# Patient Record
Sex: Female | Born: 1965 | Race: White | Hispanic: No | Marital: Married | Smoking: Never smoker
Health system: Southern US, Community
[De-identification: ages and names within clinical notes are randomized; demographics above are authoritative.]

## PROBLEM LIST (undated history)

## (undated) DIAGNOSIS — I341 Nonrheumatic mitral (valve) prolapse: Secondary | ICD-10-CM

## (undated) DIAGNOSIS — K589 Irritable bowel syndrome without diarrhea: Secondary | ICD-10-CM

## (undated) DIAGNOSIS — D649 Anemia, unspecified: Secondary | ICD-10-CM

## (undated) DIAGNOSIS — R51 Headache: Secondary | ICD-10-CM

## (undated) DIAGNOSIS — I499 Cardiac arrhythmia, unspecified: Secondary | ICD-10-CM

## (undated) DIAGNOSIS — J45909 Unspecified asthma, uncomplicated: Secondary | ICD-10-CM

## (undated) DIAGNOSIS — K219 Gastro-esophageal reflux disease without esophagitis: Secondary | ICD-10-CM

## (undated) DIAGNOSIS — R519 Headache, unspecified: Secondary | ICD-10-CM

## (undated) DIAGNOSIS — L570 Actinic keratosis: Secondary | ICD-10-CM

## (undated) DIAGNOSIS — F32A Depression, unspecified: Secondary | ICD-10-CM

## (undated) DIAGNOSIS — Z8489 Family history of other specified conditions: Secondary | ICD-10-CM

## (undated) DIAGNOSIS — F419 Anxiety disorder, unspecified: Secondary | ICD-10-CM

## (undated) DIAGNOSIS — R011 Cardiac murmur, unspecified: Secondary | ICD-10-CM

## (undated) HISTORY — DX: Gastro-esophageal reflux disease without esophagitis: K21.9

## (undated) HISTORY — DX: Irritable bowel syndrome, unspecified: K58.9

## (undated) HISTORY — DX: Actinic keratosis: L57.0

## (undated) HISTORY — DX: Anemia, unspecified: D64.9

## (undated) HISTORY — DX: Unspecified asthma, uncomplicated: J45.909

## (undated) HISTORY — DX: Nonrheumatic mitral (valve) prolapse: I34.1

---

## 2004-06-26 ENCOUNTER — Ambulatory Visit: Payer: Self-pay | Admitting: Obstetrics and Gynecology

## 2004-07-01 ENCOUNTER — Ambulatory Visit: Payer: Self-pay | Admitting: Obstetrics and Gynecology

## 2005-03-31 ENCOUNTER — Other Ambulatory Visit: Payer: Self-pay

## 2005-03-31 ENCOUNTER — Emergency Department: Payer: Self-pay | Admitting: Emergency Medicine

## 2005-07-23 ENCOUNTER — Ambulatory Visit: Payer: Self-pay

## 2005-07-28 HISTORY — PX: COLONOSCOPY: SHX174

## 2005-07-28 HISTORY — PX: COLONOSCOPY WITH ESOPHAGOGASTRODUODENOSCOPY (EGD): SHX5779

## 2005-10-06 ENCOUNTER — Ambulatory Visit: Payer: Self-pay | Admitting: Gastroenterology

## 2005-10-13 ENCOUNTER — Ambulatory Visit: Payer: Self-pay | Admitting: Gastroenterology

## 2006-09-08 ENCOUNTER — Ambulatory Visit: Payer: Self-pay | Admitting: Obstetrics and Gynecology

## 2008-03-27 ENCOUNTER — Ambulatory Visit: Payer: Self-pay | Admitting: Internal Medicine

## 2008-03-28 ENCOUNTER — Ambulatory Visit: Payer: Self-pay | Admitting: Internal Medicine

## 2008-09-05 ENCOUNTER — Ambulatory Visit: Payer: Self-pay | Admitting: Gastroenterology

## 2009-07-28 HISTORY — PX: BREAST CYST ASPIRATION: SHX578

## 2009-07-28 HISTORY — PX: HERNIA REPAIR: SHX51

## 2009-07-28 HISTORY — PX: BREAST BIOPSY: SHX20

## 2009-10-23 ENCOUNTER — Ambulatory Visit: Payer: Self-pay | Admitting: Obstetrics and Gynecology

## 2009-11-05 ENCOUNTER — Ambulatory Visit: Payer: Self-pay | Admitting: Obstetrics and Gynecology

## 2010-01-02 DIAGNOSIS — D239 Other benign neoplasm of skin, unspecified: Secondary | ICD-10-CM

## 2010-01-02 HISTORY — DX: Other benign neoplasm of skin, unspecified: D23.9

## 2010-02-25 ENCOUNTER — Ambulatory Visit: Payer: Self-pay

## 2010-05-02 ENCOUNTER — Ambulatory Visit: Payer: Self-pay | Admitting: General Surgery

## 2010-06-14 ENCOUNTER — Ambulatory Visit: Payer: Self-pay | Admitting: Anesthesiology

## 2010-06-17 ENCOUNTER — Ambulatory Visit: Payer: Self-pay | Admitting: General Surgery

## 2010-09-06 ENCOUNTER — Ambulatory Visit: Payer: Self-pay | Admitting: Internal Medicine

## 2010-11-20 ENCOUNTER — Ambulatory Visit: Payer: Self-pay | Admitting: Obstetrics and Gynecology

## 2012-02-18 ENCOUNTER — Ambulatory Visit: Payer: Self-pay | Admitting: Obstetrics and Gynecology

## 2013-03-02 ENCOUNTER — Ambulatory Visit: Payer: Self-pay | Admitting: Obstetrics and Gynecology

## 2013-10-28 ENCOUNTER — Ambulatory Visit: Payer: Self-pay | Admitting: Obstetrics and Gynecology

## 2014-04-11 ENCOUNTER — Ambulatory Visit: Payer: Self-pay | Admitting: Obstetrics and Gynecology

## 2015-05-17 ENCOUNTER — Other Ambulatory Visit: Payer: Self-pay | Admitting: Obstetrics and Gynecology

## 2015-05-17 DIAGNOSIS — Z1231 Encounter for screening mammogram for malignant neoplasm of breast: Secondary | ICD-10-CM

## 2015-06-26 ENCOUNTER — Ambulatory Visit
Admission: RE | Admit: 2015-06-26 | Discharge: 2015-06-26 | Disposition: A | Discharge status: Home / Self Care | Payer: BC Managed Care – PPO | Source: Ambulatory Visit | Attending: Obstetrics and Gynecology | Admitting: Obstetrics and Gynecology

## 2015-06-26 DIAGNOSIS — Z1231 Encounter for screening mammogram for malignant neoplasm of breast: Secondary | ICD-10-CM | POA: Diagnosis present

## 2016-01-22 ENCOUNTER — Other Ambulatory Visit: Payer: Self-pay | Admitting: Internal Medicine

## 2016-01-22 DIAGNOSIS — R1011 Right upper quadrant pain: Secondary | ICD-10-CM

## 2016-01-23 ENCOUNTER — Ambulatory Visit
Admission: RE | Admit: 2016-01-23 | Discharge: 2016-01-23 | Disposition: A | Discharge status: Home / Self Care | Payer: BC Managed Care – PPO | Source: Ambulatory Visit | Attending: Internal Medicine | Admitting: Internal Medicine

## 2016-01-23 DIAGNOSIS — R1011 Right upper quadrant pain: Secondary | ICD-10-CM

## 2016-01-23 DIAGNOSIS — R935 Abnormal findings on diagnostic imaging of other abdominal regions, including retroperitoneum: Secondary | ICD-10-CM | POA: Insufficient documentation

## 2016-01-23 DIAGNOSIS — K8020 Calculus of gallbladder without cholecystitis without obstruction: Secondary | ICD-10-CM | POA: Insufficient documentation

## 2016-01-23 MED ORDER — TECHNETIUM TC 99M MEBROFENIN IV KIT
4.9900 | PACK | Freq: Once | INTRAVENOUS | Status: AC | PRN
  Administered 2016-01-23: 4.99 via INTRAVENOUS

## 2016-02-05 ENCOUNTER — Encounter: Payer: Self-pay | Admitting: *Deleted

## 2016-02-11 ENCOUNTER — Ambulatory Visit (INDEPENDENT_AMBULATORY_CARE_PROVIDER_SITE_OTHER): Payer: BC Managed Care – PPO | Admitting: General Surgery

## 2016-02-11 ENCOUNTER — Encounter: Payer: Self-pay | Admitting: General Surgery

## 2016-02-11 VITALS — BP 132/80 | HR 78 | Resp 12 | Ht 66.0 in | Wt 160.0 lb

## 2016-02-11 DIAGNOSIS — K8020 Calculus of gallbladder without cholecystitis without obstruction: Secondary | ICD-10-CM | POA: Diagnosis not present

## 2016-02-11 NOTE — Progress Notes (Addendum)
Patient ID: Leslie Lowe, female   DOB: [birth year], 50 y.o.   MRN: [id number]  Chief Complaint  Patient presents with  . Abdominal Pain    HPI Leslie Lowe is a 50 y.o. female here today for a evaluation of abdominal pain.  She states the pain is in her right upper quadrant and radiates to her right mid back area been going on for a month..Pain mainly at night, she states she has Then awakened during the middle of the night.  Has had a lot of acid reflex and bloating, but this is distinctly different from her present right upper quadrant pain. ColonoscopyAnd upper endoscopy performed in 2007 for IBS. Moves her bowels daily.   Patient had an ultrasound and HIDA scan done on 01/23/16. During her HIDA scan no abdominal pain.   I personally reviewed the patient's history. HPI  Past Medical History  Diagnosis Date  . Asthma   . Anemia   . Mitral valve prolapse   . IBS (irritable bowel syndrome)   . GERD (gastroesophageal reflux disease)     Past Surgical History  Procedure Laterality Date  . Breast cyst aspiration Right 2011    neg  . Breast biopsy Right 2011    neg/ ultrasound guided core  . Breast biopsy Right 2011  . Hernia repair  [id number]    umbilical  . Colonoscopy  2007    Family History  Problem Relation Age of Onset  . Breast cancer Maternal Aunt 44  . Breast cancer Maternal Grandmother 24  . Diabetes Brother   . Hypertension Mother   . Asthma Mother     Social History Social History  Substance Use Topics  . Smoking status: Never Smoker   . Smokeless tobacco: None  . Alcohol Use: 2.4 oz/week    4 Standard drinks or equivalent per week    Allergies  Allergen Reactions  . Erythromycin Ethylsuccinate Nausea And Vomiting  . Promethazine Nausea And Vomiting  . Sulfa Antibiotics Hives    Current Outpatient Prescriptions  Medication Sig Dispense Refill  . albuterol (PROAIR HFA) 108 (90 Base) MCG/ACT inhaler Inhale into the lungs.    Marland Kitchen amitriptyline  (ELAVIL) 10 MG tablet Take 20 mg by mouth at bedtime.  1  . aspirin-acetaminophen-caffeine (EXCEDRIN MIGRAINE) 250-250-65 MG tablet Take by mouth as needed.    . Budesonide 90 MCG/ACT inhaler Inhale into the lungs.    . Cetirizine HCl 10 MG CAPS Take by mouth.    . fluticasone (FLONASE) 50 MCG/ACT nasal spray     . pantoprazole (PROTONIX) 40 MG tablet Take 40 mg by mouth daily.  2   No current facility-administered medications for this visit.    Review of Systems Review of Systems  Constitutional: Negative.   Respiratory: Negative.   Cardiovascular: Negative.   Gastrointestinal: Positive for abdominal pain and diarrhea.    Blood pressure 132/80, pulse 78, resp. rate 12, height 5\' 6"  (1.676 m), weight 160 lb (72.576 kg), last menstrual period 02/03/2016.  Physical Exam Physical Exam  Constitutional: She is oriented to person, place, and time. She appears well-developed and well-nourished.  Eyes: Conjunctivae are normal. No scleral icterus.  Neck: Neck supple.  Cardiovascular: Normal rate and regular rhythm.   Pulmonary/Chest: Effort normal and breath sounds normal.  Abdominal: Soft. Normal appearance and bowel sounds are normal. There is no hepatomegaly. There is no tenderness. No hernia.  Lymphadenopathy:    She has no cervical adenopathy.  Neurological: She is alert and oriented  to person, place, and time.  Skin: Skin is warm and dry.    Data Reviewed Abdominal ultrasound of 01/23/2016 was reviewed. Multiple foci, largest measuring 2.2 cm. No gallbladder wall thickening. Common bile duct 2 mm. Normal liver parenchymal echogenicity.  HIDA scan with ejection fraction dated 01/23/2016 ordered an ejection fraction of 22% after 8 ounces of unsure. No symptoms of abdominal pain reported by the patient.  PCP notes from Emily Filbert, M.D. dated 01/22/2016 reviewed.  PCP labs from January 2017 showed a hemoglobin 11.7 with an MCV of 82. Normal comprehensive metabolic  panel.  [id number] umbilical hernia repair was completed making use of a small Ventralex patch.  Assessment    Symptomatic gallstones. Lack of symptoms during "stimulation" with ensure likely secondary to chronic dysfunction.    Plan    Options for management were reviewed: Observation versus elective cholecystectomy. With the large stone she is not a candidate for oral dissolution therapy.  Laparoscopic Cholecystectomy with Intraoperative Cholangiogram. The procedure, including it's potential risks and complications (including but not limited to infection, bleeding, injury to intra-abdominal organs or bile ducts, bile leak, poor cosmetic result, sepsis and death) were discussed with the patient in detail. Non-operative options, including their inherent risks (acute calculous cholecystitis with possible choledocholithiasis or gallstone pancreatitis, with the risk of ascending cholangitis, sepsis, and death) were discussed as well. The patient expressed and understanding of what we discussed and wishes to proceed with laparoscopic cholecystectomy. The patient further understands that if it is technically not possible, or it is unsafe to proceed laparoscopically, that I will convert to an open cholecystectomy.  Patient's surgery has been scheduled for 03-10-16 at [facility].     PCP:  Emily Filbert  This information has been scribed by Gaspar Cola CMA.     Robert Bellow 02/12/2016, 11:48 AM

## 2016-02-11 NOTE — Patient Instructions (Signed)
Laparoscopic Cholecystectomy Laparoscopic cholecystectomy is surgery to remove the gallbladder. The gallbladder is located in the upper right part of the abdomen, behind the liver. It is a storage sac for bile, which is produced in the liver. Bile aids in the digestion and absorption of fats. Cholecystectomy is often done for inflammation of the gallbladder (cholecystitis). This condition is usually caused by a buildup of gallstones (cholelithiasis) in the gallbladder. Gallstones can block the flow of bile, and that can result in inflammation and pain. In severe cases, emergency surgery may be required. If emergency surgery is not required, you will have time to prepare for the procedure. Laparoscopic surgery is an alternative to open surgery. Laparoscopic surgery has a shorter recovery time. Your common bile duct may also need to be examined during the procedure. If stones are found in the common bile duct, they may be removed. LET YOUR HEALTH CARE PROVIDER KNOW ABOUT:  Any allergies you have.  All medicines you are taking, including vitamins, herbs, eye drops, creams, and over-the-counter medicines.  Previous problems you or members of your family have had with the use of anesthetics.  Any blood disorders you have.  Previous surgeries you have had.  Any medical conditions you have. RISKS AND COMPLICATIONS Generally, this is a safe procedure. However, problems may occur, including:  Infection.  Bleeding.  Allergic reactions to medicines.  Damage to other structures or organs.  A stone remaining in the common bile duct.  A bile leak from the cyst duct that is clipped when your gallbladder is removed.  The need to convert to open surgery, which requires a larger incision in the abdomen. This may be necessary if your surgeon thinks that it is not safe to continue with a laparoscopic procedure. BEFORE THE PROCEDURE  Ask your health care provider about:  Changing or stopping your  regular medicines. This is especially important if you are taking diabetes medicines or blood thinners.  Taking medicines such as aspirin and ibuprofen. These medicines can thin your blood. Do not take these medicines before your procedure if your health care provider instructs you not to.  Follow instructions from your health care provider about eating or drinking restrictions.  Let your health care provider know if you develop a cold or an infection before surgery.  Plan to have someone take you home after the procedure.  Ask your health care provider how your surgical site will be marked or identified.  You may be given antibiotic medicine to help prevent infection. PROCEDURE  To reduce your risk of infection:  Your health care team will wash or sanitize their hands.  Your skin will be washed with soap.  An IV tube may be inserted into one of your veins.  You will be given a medicine to make you fall asleep (general anesthetic).  A breathing tube will be placed in your mouth.  The surgeon will make several small cuts (incisions) in your abdomen.  A thin, lighted tube (laparoscope) that has a tiny camera on the end will be inserted through one of the small incisions. The camera on the laparoscope will send a picture to a TV screen (monitor) in the operating room. This will give the surgeon a good view inside your abdomen.  A gas will be pumped into your abdomen. This will expand your abdomen to give the surgeon more room to perform the surgery.  Other tools that are needed for the procedure will be inserted through the other incisions. The gallbladder will   be removed through one of the incisions.  After your gallbladder has been removed, the incisions will be closed with stitches (sutures), staples, or skin glue.  Your incisions may be covered with a bandage (dressing). The procedure may vary among health care providers and hospitals. AFTER THE PROCEDURE  Your blood  pressure, heart rate, breathing rate, and blood oxygen level will be monitored often until the medicines you were given have worn off.  You will be given medicines as needed to control your pain.   This information is not intended to replace advice given to you by your health care provider. Make sure you discuss any questions you have with your health care provider.   Document Released: 07/14/2005 Document Revised: 04/04/2015 Document Reviewed: 02/23/2013 Elsevier Interactive Patient Education 2016 [employer] Inc[employer]  

## 2016-02-12 DIAGNOSIS — K8020 Calculus of gallbladder without cholecystitis without obstruction: Secondary | ICD-10-CM | POA: Insufficient documentation

## 2016-02-12 NOTE — Addendum Note (Signed)
Addended by: Robert Bellow on: 02/12/2016 11:59 AM   Modules accepted: Orders

## 2016-02-12 NOTE — H&P (Signed)
HPI  Leslie Lowe is a 50 y.o. female here today for a evaluation of abdominal pain. She states the pain is in her right upper quadrant and radiates to her right mid back area been going on for a month..Pain mainly at night, she states she has Then awakened during the middle of the night. Has had a lot of acid reflex and bloating, but this is distinctly different from her present right upper quadrant pain.  ColonoscopyAnd upper endoscopy performed in 2007 for IBS.  Moves her bowels daily.  Patient had an ultrasound and HIDA scan done on 01/23/16. During her HIDA scan no abdominal pain.  I personally reviewed the patient's history.  HPI  Past Medical History   Diagnosis  Date   .  Asthma    .  Anemia    .  Mitral valve prolapse    .  IBS (irritable bowel syndrome)    .  GERD (gastroesophageal reflux disease)     Past Surgical History   Procedure  Laterality  Date   .  Breast cyst aspiration  Right  2011     neg   .  Breast biopsy  Right  2011     neg/ ultrasound guided core   .  Breast biopsy  Right  2011   .  Hernia repair   [id number]     umbilical   .  Colonoscopy   2007    Family History   Problem  Relation  Age of Onset   .  Breast cancer  Maternal Aunt  65   .  Breast cancer  Maternal Grandmother  44   .  Diabetes  Brother    .  Hypertension  Mother    .  Asthma  Mother     Social History  Social History   Substance Use Topics   .  Smoking status:  Never Smoker   .  Smokeless tobacco:  None   .  Alcohol Use:  2.4 oz/week     4 Standard drinks or equivalent per week    Allergies   Allergen  Reactions   .  Erythromycin Ethylsuccinate  Nausea And Vomiting   .  Promethazine  Nausea And Vomiting   .  Sulfa Antibiotics  Hives    Current Outpatient Prescriptions   Medication  Sig  Dispense  Refill   .  albuterol (PROAIR HFA) 108 (90 Base) MCG/ACT inhaler  Inhale into the lungs.     Marland Kitchen  amitriptyline (ELAVIL) 10 MG tablet  Take 20 mg by mouth at bedtime.   1   .   aspirin-acetaminophen-caffeine (EXCEDRIN MIGRAINE) 250-250-65 MG tablet  Take by mouth as needed.     .  Budesonide 90 MCG/ACT inhaler  Inhale into the lungs.     .  Cetirizine HCl 10 MG CAPS  Take by mouth.     .  fluticasone (FLONASE) 50 MCG/ACT nasal spray      .  pantoprazole (PROTONIX) 40 MG tablet  Take 40 mg by mouth daily.   2    No current facility-administered medications for this visit.    Review of Systems  Review of Systems  Constitutional: Negative.  Respiratory: Negative.  Cardiovascular: Negative.  Gastrointestinal: Positive for abdominal pain and diarrhea.   Blood pressure 132/80, pulse 78, resp. rate 12, height 5\' 6"  (1.676 m), weight 160 lb (72.576 kg), last menstrual period 02/03/2016.  Physical Exam  Physical Exam  Constitutional: She is oriented to person,  place, and time. She appears well-developed and well-nourished.  Eyes: Conjunctivae are normal. No scleral icterus.  Neck: Neck supple.  Cardiovascular: Normal rate and regular rhythm.  Pulmonary/Chest: Effort normal and breath sounds normal.  Abdominal: Soft. Normal appearance and bowel sounds are normal. There is no hepatomegaly. There is no tenderness. No hernia.  Lymphadenopathy:  She has no cervical adenopathy.  Neurological: She is alert and oriented to person, place, and time.  Skin: Skin is warm and dry.   Data Reviewed  Abdominal ultrasound of 01/23/2016 was reviewed. Multiple foci, largest measuring 2.2 cm. No gallbladder wall thickening. Common bile duct 2 mm. Normal liver parenchymal echogenicity.  HIDA scan with ejection fraction dated 01/23/2016 ordered an ejection fraction of 22% after 8 ounces of unsure. No symptoms of abdominal pain reported by the patient.  PCP notes from Emily Filbert, M.D. dated 01/22/2016 reviewed.  PCP labs from January 2017 showed a hemoglobin 11.7 with an MCV of 82. Normal comprehensive metabolic panel.  99991111 umbilical hernia repair was completed making use of a  small Ventralex patch.  Assessment   Symptomatic gallstones. Lack of symptoms during "stimulation" with ensure likely secondary to chronic dysfunction.   Plan   Options for management were reviewed: Observation versus elective cholecystectomy. With the large stone she is not a candidate for oral dissolution therapy.  Laparoscopic Cholecystectomy with Intraoperative Cholangiogram. The procedure, including it's potential risks and complications (including but not limited to infection, bleeding, injury to intra-abdominal organs or bile ducts, bile leak, poor cosmetic result, sepsis and death) were discussed with the patient in detail. Non-operative options, including their inherent risks (acute calculous cholecystitis with possible choledocholithiasis or gallstone pancreatitis, with the risk of ascending cholangitis, sepsis, and death) were discussed as well. The patient expressed and understanding of what we discussed and wishes to proceed with laparoscopic cholecystectomy. The patient further understands that if it is technically not possible, or it is unsafe to proceed laparoscopically, that I will convert to an open cholecystectomy.  Patient's surgery has been scheduled for 03-10-16 at [facility]  PCP: Emily Filbert  This information has been scribed by Gaspar Cola CMA.  Robert Bellow  02/12/2016, 11:48 AM

## 2016-02-28 ENCOUNTER — Encounter
Admission: RE | Admit: 2016-02-28 | Discharge: 2016-02-28 | Disposition: A | Discharge status: Home / Self Care | Payer: BC Managed Care – PPO | Source: Ambulatory Visit | Attending: General Surgery | Admitting: General Surgery

## 2016-02-28 DIAGNOSIS — I341 Nonrheumatic mitral (valve) prolapse: Secondary | ICD-10-CM | POA: Diagnosis not present

## 2016-02-28 HISTORY — DX: Family history of other specified conditions: Z84.89

## 2016-02-28 HISTORY — DX: Cardiac arrhythmia, unspecified: I49.9

## 2016-02-28 HISTORY — DX: Headache, unspecified: R51.9

## 2016-02-28 HISTORY — DX: Headache: R51

## 2016-02-28 NOTE — Patient Instructions (Signed)
  Your procedure is scheduled on: 03-10-16 [facility]) Report to Same Day Surgery 2nd floor medical mall To find out your arrival time please call [phone] between 1PM - 3PM on 03-07-16 (FRIDAY)  Remember: Instructions that are not followed completely may result in serious medical risk, up to and including death, or upon the discretion of your surgeon and anesthesiologist your surgery may need to be rescheduled.    _x___ 1. Do not eat food or drink liquids after midnight. No gum chewing or hard candies.     __x__ 2. No Alcohol for 24 hours before or after surgery.   __x__3. No Smoking for 24 prior to surgery.   ____  4. Bring all medications with you on the day of surgery if instructed.    __x__ 5. Notify your doctor if there is any change in your medical condition     (cold, fever, infections).     Do not wear jewelry, make-up, hairpins, clips or nail polish.  Do not wear lotions, powders, or perfumes. You may wear deodorant.  Do not shave 48 hours prior to surgery. Men may shave face and neck.  Do not bring valuables to the hospital.    [facility] is not responsible for any belongings or valuables.               Contacts, dentures or bridgework may not be worn into surgery.  Leave your suitcase in the car. After surgery it may be brought to your room.  For patients admitted to the hospital, discharge time is determined by your treatment team.   Patients discharged the day of surgery will not be allowed to drive home.    Please read over the following fact sheets that you were given:   [facility] Preparing for Surgery and or MRSA Information   _x___ Take these medicines the morning of surgery with A SIP OF WATER:    1. PROTONIX  2. ZYRTEC  3.  4.  5.  6.  ____ Fleet Enema (as directed)   _x___ Use CHG Soap or sage wipes as directed on instruction sheet   _X___ Use inhalers on the day of surgery and bring to hospital day of surgery-USE ALBUTEROL INHALER AT [facility]  ____ Stop metformin 2 days prior to surgery    ____ Take 1/2 of usual insulin dose the night before surgery and none on the morning of surgery.   ____ Stop aspirin or coumadin, or plavix  ___ Stop Anti-inflammatories such as Advil, Aleve, Ibuprofen, Motrin, Naproxen,          Naprosyn, Goodies powders or aspirin products. Ok to take Tylenol.   ____ Stop supplements until after surgery.    ____ Bring C-Pap to the hospital.

## 2016-03-03 ENCOUNTER — Encounter
Admission: RE | Admit: 2016-03-03 | Discharge: 2016-03-03 | Disposition: A | Discharge status: Home / Self Care | Payer: BC Managed Care – PPO | Source: Ambulatory Visit | Attending: General Surgery | Admitting: General Surgery

## 2016-03-07 MED ORDER — DEXTROSE 5 % IV SOLN: 2.0000 g | Freq: Once | INTRAVENOUS | Status: DC

## 2016-03-07 NOTE — Pre-Procedure Instructions (Signed)
Pt with MVP who has to be premedicated with an antibiotic prior to dental procedures-pt having her gallbladder taken out on Monday 8-14 and does not have an antibiotic ordered preop- called Melanie at Dr [place] office to see if antibiotic is needed preop for gallbladder surgery due to MVP- Melanie to check with Gwenette Greet about this as Dr Bary Castilla is out of town currently

## 2016-03-07 NOTE — Addendum Note (Signed)
Addended by: Christene Lye on: 03/07/2016 09:58 AM   Modules accepted: Orders

## 2016-03-10 ENCOUNTER — Encounter: Payer: Self-pay | Admitting: *Deleted

## 2016-03-10 ENCOUNTER — Ambulatory Visit: Payer: BC Managed Care – PPO | Admitting: Certified Registered"

## 2016-03-10 ENCOUNTER — Encounter
Admission: RE | Disposition: A | Discharge status: Home / Self Care | Payer: Self-pay | Source: Ambulatory Visit | Attending: General Surgery

## 2016-03-10 ENCOUNTER — Ambulatory Visit
Admission: RE | Admit: 2016-03-10 | Discharge: 2016-03-10 | Disposition: A | Discharge status: Home / Self Care | Payer: BC Managed Care – PPO | Source: Ambulatory Visit | Attending: General Surgery | Admitting: General Surgery

## 2016-03-10 ENCOUNTER — Ambulatory Visit: Payer: BC Managed Care – PPO

## 2016-03-10 DIAGNOSIS — J45909 Unspecified asthma, uncomplicated: Secondary | ICD-10-CM | POA: Diagnosis not present

## 2016-03-10 DIAGNOSIS — K8000 Calculus of gallbladder with acute cholecystitis without obstruction: Secondary | ICD-10-CM

## 2016-03-10 DIAGNOSIS — Z882 Allergy status to sulfonamides status: Secondary | ICD-10-CM | POA: Insufficient documentation

## 2016-03-10 DIAGNOSIS — K219 Gastro-esophageal reflux disease without esophagitis: Secondary | ICD-10-CM | POA: Diagnosis not present

## 2016-03-10 DIAGNOSIS — K8020 Calculus of gallbladder without cholecystitis without obstruction: Secondary | ICD-10-CM

## 2016-03-10 DIAGNOSIS — K8044 Calculus of bile duct with chronic cholecystitis without obstruction: Secondary | ICD-10-CM | POA: Diagnosis not present

## 2016-03-10 DIAGNOSIS — Z881 Allergy status to other antibiotic agents status: Secondary | ICD-10-CM | POA: Diagnosis not present

## 2016-03-10 DIAGNOSIS — Z91013 Allergy to seafood: Secondary | ICD-10-CM | POA: Insufficient documentation

## 2016-03-10 DIAGNOSIS — Z888 Allergy status to other drugs, medicaments and biological substances status: Secondary | ICD-10-CM | POA: Insufficient documentation

## 2016-03-10 DIAGNOSIS — K8064 Calculus of gallbladder and bile duct with chronic cholecystitis without obstruction: Secondary | ICD-10-CM | POA: Diagnosis not present

## 2016-03-10 DIAGNOSIS — K589 Irritable bowel syndrome without diarrhea: Secondary | ICD-10-CM | POA: Insufficient documentation

## 2016-03-10 DIAGNOSIS — Z7951 Long term (current) use of inhaled steroids: Secondary | ICD-10-CM | POA: Insufficient documentation

## 2016-03-10 DIAGNOSIS — Z79899 Other long term (current) drug therapy: Secondary | ICD-10-CM | POA: Diagnosis not present

## 2016-03-10 DIAGNOSIS — I341 Nonrheumatic mitral (valve) prolapse: Secondary | ICD-10-CM | POA: Insufficient documentation

## 2016-03-10 DIAGNOSIS — Z7982 Long term (current) use of aspirin: Secondary | ICD-10-CM | POA: Insufficient documentation

## 2016-03-10 DIAGNOSIS — Z9889 Other specified postprocedural states: Secondary | ICD-10-CM | POA: Diagnosis not present

## 2016-03-10 HISTORY — PX: CHOLECYSTECTOMY: SHX55

## 2016-03-10 LAB — POCT PREGNANCY, URINE: PREG TEST UR: NEGATIVE

## 2016-03-10 SURGERY — LAPAROSCOPIC CHOLECYSTECTOMY WITH INTRAOPERATIVE CHOLANGIOGRAM
Anesthesia: General | Wound class: Clean Contaminated

## 2016-03-10 MED ORDER — HYDROMORPHONE HCL 1 MG/ML IJ SOLN
INTRAMUSCULAR | Status: AC
  Filled 2016-03-10: qty 1

## 2016-03-10 MED ORDER — FENTANYL CITRATE (PF) 100 MCG/2ML IJ SOLN
INTRAMUSCULAR | Status: DC | PRN
  Administered 2016-03-10: 50 ug via INTRAVENOUS
  Administered 2016-03-10: 100 ug via INTRAVENOUS
  Administered 2016-03-10 (×2): 50 ug via INTRAVENOUS

## 2016-03-10 MED ORDER — KETOROLAC TROMETHAMINE 30 MG/ML IJ SOLN
30.0000 mg | Freq: Once | INTRAMUSCULAR | Status: AC
  Administered 2016-03-10: 30 mg via INTRAVENOUS

## 2016-03-10 MED ORDER — ROCURONIUM BROMIDE 100 MG/10ML IV SOLN
INTRAVENOUS | Status: DC | PRN
  Administered 2016-03-10 (×2): 10 mg via INTRAVENOUS
  Administered 2016-03-10: 40 mg via INTRAVENOUS

## 2016-03-10 MED ORDER — LIDOCAINE HCL (CARDIAC) 20 MG/ML IV SOLN
INTRAVENOUS | Status: DC | PRN
  Administered 2016-03-10: 80 mg via INTRAVENOUS

## 2016-03-10 MED ORDER — NEOSTIGMINE METHYLSULFATE 10 MG/10ML IV SOLN
INTRAVENOUS | Status: DC | PRN
  Administered 2016-03-10: 3 mg via INTRAVENOUS

## 2016-03-10 MED ORDER — SCOPOLAMINE 1 MG/3DAYS TD PT72
1.0000 | MEDICATED_PATCH | Freq: Once | TRANSDERMAL | Status: DC
  Administered 2016-03-10: 1.5 mg via TRANSDERMAL

## 2016-03-10 MED ORDER — SCOPOLAMINE 1 MG/3DAYS TD PT72
MEDICATED_PATCH | TRANSDERMAL | Status: AC
  Administered 2016-03-10: 1.5 mg via TRANSDERMAL
  Filled 2016-03-10: qty 1

## 2016-03-10 MED ORDER — SODIUM CHLORIDE 0.9 % IJ SOLN
INTRAMUSCULAR | Status: AC
  Filled 2016-03-10: qty 50

## 2016-03-10 MED ORDER — ACETAMINOPHEN 10 MG/ML IV SOLN
INTRAVENOUS | Status: AC
  Filled 2016-03-10: qty 100

## 2016-03-10 MED ORDER — HYDROCODONE-ACETAMINOPHEN 5-325 MG PO TABS: 1.0000 | ORAL_TABLET | ORAL | 0 refills | Status: DC | PRN

## 2016-03-10 MED ORDER — DEXAMETHASONE SODIUM PHOSPHATE 10 MG/ML IJ SOLN
INTRAMUSCULAR | Status: DC | PRN
  Administered 2016-03-10: 4 mg via INTRAVENOUS

## 2016-03-10 MED ORDER — HYDROMORPHONE HCL 1 MG/ML IJ SOLN
0.5000 mg | INTRAMUSCULAR | Status: DC | PRN
  Administered 2016-03-10 (×2): 0.5 mg via INTRAVENOUS

## 2016-03-10 MED ORDER — MIDAZOLAM HCL 2 MG/2ML IJ SOLN
INTRAMUSCULAR | Status: DC | PRN
  Administered 2016-03-10: 2 mg via INTRAVENOUS

## 2016-03-10 MED ORDER — FENTANYL CITRATE (PF) 100 MCG/2ML IJ SOLN
INTRAMUSCULAR | Status: AC
  Administered 2016-03-10: 25 ug
  Filled 2016-03-10: qty 2

## 2016-03-10 MED ORDER — OXYCODONE HCL 5 MG PO TABS
5.0000 mg | ORAL_TABLET | Freq: Once | ORAL | Status: AC | PRN
Start: 2016-03-10 — End: 2016-03-10
  Administered 2016-03-10: 5 mg via ORAL

## 2016-03-10 MED ORDER — GLYCOPYRROLATE 0.2 MG/ML IJ SOLN
INTRAMUSCULAR | Status: DC | PRN
  Administered 2016-03-10: .4 mg via INTRAVENOUS

## 2016-03-10 MED ORDER — OXYCODONE HCL 5 MG/5ML PO SOLN: 5.0000 mg | Freq: Once | ORAL | Status: AC | PRN

## 2016-03-10 MED ORDER — CEFAZOLIN SODIUM-DEXTROSE 2-4 GM/100ML-% IV SOLN
INTRAVENOUS | Status: AC
  Administered 2016-03-10: 2000 mg
  Filled 2016-03-10: qty 100

## 2016-03-10 MED ORDER — ONDANSETRON HCL 4 MG/2ML IJ SOLN
INTRAMUSCULAR | Status: DC | PRN
  Administered 2016-03-10: 4 mg via INTRAVENOUS

## 2016-03-10 MED ORDER — LACTATED RINGERS IV SOLN
INTRAVENOUS | Status: DC
  Administered 2016-03-10: 12:00:00 via INTRAVENOUS

## 2016-03-10 MED ORDER — FENTANYL CITRATE (PF) 100 MCG/2ML IJ SOLN
25.0000 ug | INTRAMUSCULAR | Status: DC | PRN
  Administered 2016-03-10 (×2): 50 ug via INTRAVENOUS
  Administered 2016-03-10 (×2): 25 ug via INTRAVENOUS

## 2016-03-10 MED ORDER — SODIUM CHLORIDE 0.9 % IV SOLN
INTRAVENOUS | Status: DC | PRN
  Administered 2016-03-10: 10 mL

## 2016-03-10 MED ORDER — OXYCODONE HCL 5 MG PO TABS
ORAL_TABLET | ORAL | Status: AC
  Filled 2016-03-10: qty 1

## 2016-03-10 MED ORDER — FENTANYL CITRATE (PF) 100 MCG/2ML IJ SOLN
INTRAMUSCULAR | Status: AC
  Filled 2016-03-10: qty 2

## 2016-03-10 MED ORDER — PROPOFOL 10 MG/ML IV BOLUS
INTRAVENOUS | Status: DC | PRN
  Administered 2016-03-10: 140 mg via INTRAVENOUS

## 2016-03-10 MED ORDER — ACETAMINOPHEN 10 MG/ML IV SOLN
INTRAVENOUS | Status: DC | PRN
  Administered 2016-03-10: 1000 mg via INTRAVENOUS

## 2016-03-10 MED ORDER — KETOROLAC TROMETHAMINE 30 MG/ML IJ SOLN
INTRAMUSCULAR | Status: AC
  Filled 2016-03-10: qty 1

## 2016-03-10 SURGICAL SUPPLY — 49 items
APPLIER CLIP ROT 10 11.4 M/L (STAPLE) ×3
BLADE SURG 11 STRL SS SAFETY (MISCELLANEOUS) ×3 IMPLANT
CANISTER SUCT 1200ML W/VALVE (MISCELLANEOUS) ×3 IMPLANT
CANNULA DILATOR  5MM W/SLV (CANNULA) ×2
CANNULA DILATOR 10 W/SLV (CANNULA) ×2 IMPLANT
CANNULA DILATOR 10MM W/SLV (CANNULA) ×1
CANNULA DILATOR 5 W/SLV (CANNULA) ×4 IMPLANT
CATH CHOLANG 76X19 KUMAR (CATHETERS) ×3 IMPLANT
CHLORAPREP W/TINT 26ML (MISCELLANEOUS) ×3 IMPLANT
CLIP APPLIE ROT 10 11.4 M/L (STAPLE) ×1 IMPLANT
CLOSURE WOUND 1/2 X4 (GAUZE/BANDAGES/DRESSINGS) ×1
CONRAY 60ML FOR OR (MISCELLANEOUS) ×3 IMPLANT
DISSECTOR KITTNER STICK (MISCELLANEOUS) IMPLANT
DISSECTORS/KITTNER STICK (MISCELLANEOUS)
DRAPE SHEET LG 3/4 BI-LAMINATE (DRAPES) ×3 IMPLANT
DRESSING TELFA 4X3 1S ST N-ADH (GAUZE/BANDAGES/DRESSINGS) ×3 IMPLANT
DRSG TEGADERM 2-3/8X2-3/4 SM (GAUZE/BANDAGES/DRESSINGS) ×12 IMPLANT
ELECT REM PT RETURN 9FT ADLT (ELECTROSURGICAL) ×3
ELECTRODE REM PT RTRN 9FT ADLT (ELECTROSURGICAL) ×1 IMPLANT
ENDOPOUCH RETRIEVER 10 (MISCELLANEOUS) ×3 IMPLANT
GLOVE BIO SURGEON STRL SZ 6.5 (GLOVE) ×2 IMPLANT
GLOVE BIO SURGEON STRL SZ7.5 (GLOVE) ×3 IMPLANT
GLOVE BIO SURGEONS STRL SZ 6.5 (GLOVE) ×1
GLOVE BIOGEL PI IND STRL 7.0 (GLOVE) ×1 IMPLANT
GLOVE BIOGEL PI INDICATOR 7.0 (GLOVE) ×2
GLOVE EXAM NITRILE PF SM BLUE (GLOVE) ×3 IMPLANT
GLOVE INDICATOR 8.0 STRL GRN (GLOVE) ×3 IMPLANT
GOWN STRL REUS W/ TWL LRG LVL3 (GOWN DISPOSABLE) ×3 IMPLANT
GOWN STRL REUS W/TWL LRG LVL3 (GOWN DISPOSABLE) ×6
GRASPER SUT TROCAR 14GX15 (MISCELLANEOUS) ×3 IMPLANT
IRRIGATION STRYKERFLOW (MISCELLANEOUS) ×1 IMPLANT
IRRIGATOR STRYKERFLOW (MISCELLANEOUS) ×3
IV LACTATED RINGERS 1000ML (IV SOLUTION) ×3 IMPLANT
KIT RM TURNOVER STRD PROC AR (KITS) ×3 IMPLANT
LABEL OR SOLS (LABEL) ×3 IMPLANT
NDL INSUFF ACCESS 14 VERSASTEP (NEEDLE) ×3 IMPLANT
NS IRRIG 500ML POUR BTL (IV SOLUTION) ×3 IMPLANT
PACK LAP CHOLECYSTECTOMY (MISCELLANEOUS) ×3 IMPLANT
SCISSORS METZENBAUM CVD 33 (INSTRUMENTS) ×3 IMPLANT
SEAL FOR SCOPE WARMER C3101 (MISCELLANEOUS) ×3 IMPLANT
SLEEVE VERSASTEP EXPAND ONEST (MISCELLANEOUS) ×3 IMPLANT
STRIP CLOSURE SKIN 1/2X4 (GAUZE/BANDAGES/DRESSINGS) ×2 IMPLANT
SUT PROLENE 0 CT 2 (SUTURE) ×3 IMPLANT
SUT VIC AB 0 CT2 27 (SUTURE) ×3 IMPLANT
SUT VIC AB 4-0 FS2 27 (SUTURE) ×3 IMPLANT
SWABSTK COMLB BENZOIN TINCTURE (MISCELLANEOUS) ×3 IMPLANT
TROCAR XCEL NON-BLD 11X100MML (ENDOMECHANICALS) ×3 IMPLANT
TUBING INSUFFLATOR HI FLOW (MISCELLANEOUS) ×3 IMPLANT
WATER STERILE IRR 1000ML POUR (IV SOLUTION) ×3 IMPLANT

## 2016-03-10 NOTE — Anesthesia Postprocedure Evaluation (Signed)
Anesthesia Post Note  Patient: Leslie Lowe  Procedure(s) Performed: Procedure(s) (LRB): LAPAROSCOPIC CHOLECYSTECTOMY WITH INTRAOPERATIVE CHOLANGIOGRAM (N/A)  Patient location during evaluation: PACU Anesthesia Type: General Level of consciousness: awake and alert Pain management: pain level controlled Vital Signs Assessment: post-procedure vital signs reviewed and stable Respiratory status: spontaneous breathing, nonlabored ventilation, respiratory function stable and patient connected to nasal cannula oxygen Cardiovascular status: blood pressure returned to baseline and stable Postop Assessment: no signs of nausea or vomiting Anesthetic complications: no    Last Vitals:  Vitals:   03/10/16 1445 03/10/16 1516  BP:  132/76  Pulse:  66  Resp:  14  Temp: 37.2 C 36.4 C    Last Pain:  Vitals:   03/10/16 1516  TempSrc:   PainSc: 6                  Precious Haws [place]

## 2016-03-10 NOTE — Transfer of Care (Signed)
Immediate Anesthesia Transfer of Care Note  Patient: Leslie Lowe  Procedure(s) Performed: Procedure(s): LAPAROSCOPIC CHOLECYSTECTOMY WITH INTRAOPERATIVE CHOLANGIOGRAM (N/A)  Patient Location: PACU  Anesthesia Type:General  Level of Consciousness: sedated and responds to stimulation  Airway & Oxygen Therapy: Patient Spontanous Breathing and Patient connected to face mask oxygen  Post-op Assessment: Report given to RN and Post -op Vital signs reviewed and stable  Post vital signs: Reviewed and stable  Last Vitals:  Vitals:   03/10/16 1114 03/10/16 1348  BP: (!) 137/93 138/75  Pulse: 99 70  Resp: 18 12  Temp: 36.8 C     Last Pain:  Vitals:   03/10/16 1114  TempSrc: Oral  PainSc: 3          Complications: No apparent anesthesia complications

## 2016-03-10 NOTE — Anesthesia Procedure Notes (Signed)
Procedure Name: Intubation Performed by: Arcelia Pals Pre-anesthesia Checklist: Patient identified, Patient being monitored, Timeout performed, Emergency Drugs available and Suction available Patient Re-evaluated:Patient Re-evaluated prior to inductionOxygen Delivery Method: Circle system utilized Preoxygenation: Pre-oxygenation with 100% oxygen Intubation Type: IV induction Ventilation: Mask ventilation without difficulty Laryngoscope Size: Mac and 3 Grade View: Grade I Tube type: Oral Tube size: 7.0 mm Number of attempts: 1 Airway Equipment and Method: Stylet Placement Confirmation: ETT inserted through vocal cords under direct vision,  positive ETCO2 and breath sounds checked- equal and bilateral Secured at: 21 cm Tube secured with: Tape Dental Injury: Teeth and Oropharynx as per pre-operative assessment        

## 2016-03-10 NOTE — OR Nursing (Signed)
Dr Bary Castilla in to see pt [id number] and advises ok to d/c to home

## 2016-03-10 NOTE — Progress Notes (Signed)
Pain level 5 at discharge

## 2016-03-10 NOTE — H&P (Signed)
Leslie Lowe [id number] [birth year]     HPI: Symptomatic cholelithiasis, last attack last night. For cholecystectomy.   Facility-Administered Medications Prior to Admission  Medication Dose Route Frequency Provider Last Rate Last Dose  . ceFAZolin (ANCEF) 2 g in dextrose 5 % 100 mL IVPB  2 g Intravenous Once Seeplaputhur Robinette Haines, MD       Prescriptions Prior to Admission  Medication Sig Dispense Refill Last Dose  . albuterol (PROAIR HFA) 108 (90 Base) MCG/ACT inhaler Inhale 1 puff into the lungs every 6 (six) hours as needed for shortness of breath.    03/10/2016 at Unknown time  . amitriptyline (ELAVIL) 10 MG tablet Take 10 mg by mouth at bedtime.   1 03/09/2016 at Unknown time  . amoxicillin (AMOXIL) 500 MG tablet Take 500 mg by mouth as needed (take 4 tablets prior to dental procedure).     Marland Kitchen aspirin-acetaminophen-caffeine (EXCEDRIN MIGRAINE) 250-250-65 MG tablet Take 1 tablet by mouth every 6 (six) hours as needed for headache or migraine.    02/25/2016  . Budesonide 90 MCG/ACT inhaler Inhale 1 puff into the lungs at bedtime.    Taking  . Cetirizine HCl 10 MG CAPS Take 10 mg by mouth every morning.    03/10/2016 at Unknown time  . EPINEPHrine 0.3 mg/0.3 mL IJ SOAJ injection Inject 0.3 mg into the skin once.   12   . fluticasone (FLONASE) 50 MCG/ACT nasal spray Place 1 spray into both nostrils daily.    03/09/2016 at Unknown time  . Iron-Vitamin C (VITRON-C) 65-125 MG TABS Take 1 tablet by mouth 2 (two) times a week.   03/03/2016  . naproxen sodium (ANAPROX) 220 MG tablet Take 220 mg by mouth 2 (two) times daily as needed (pain).    03/05/2016  . pantoprazole (PROTONIX) 40 MG tablet Take 40 mg by mouth every evening.   2 03/10/2016 at Unknown time  . pseudoephedrine-guaifenesin (MUCINEX D) 60-600 MG 12 hr tablet Take 1 tablet by mouth as needed for congestion.   03/04/2016  . PULMICORT FLEXHALER 180 MCG/ACT inhaler Inhale 2 puffs into the lungs 2 (two) times daily as needed (allergies).   11 03/10/2016  at Unknown time   Allergies  Allergen Reactions  . Erythromycin Ethylsuccinate Nausea And Vomiting  . Promethazine Nausea And Vomiting  . Shellfish Allergy Itching and Nausea And Vomiting    wheezing  . Sulfa Antibiotics Hives   Past Medical History:  Diagnosis Date  . Anemia   . Asthma   . Dysrhythmia    flutters occ   . Family history of adverse reaction to anesthesia    pts mom-n/v  . GERD (gastroesophageal reflux disease)   . Headache   . IBS (irritable bowel syndrome)   . Mitral valve prolapse    Past Surgical History:  Procedure Laterality Date  . BREAST BIOPSY Right 2011   neg/ ultrasound guided core  . BREAST BIOPSY Right 2011  . BREAST CYST ASPIRATION Right 2011   neg  . COLONOSCOPY  2007  . COLONOSCOPY WITH ESOPHAGOGASTRODUODENOSCOPY (EGD)  2007  . HERNIA REPAIR  [id number]   umbilical   Social History   Social History  . Marital status: Married    Spouse name: N/A  . Number of children: N/A  . Years of education: N/A   Occupational History  . Not on file.   Social History Main Topics  . Smoking status: Never Smoker  . Smokeless tobacco: Never Used  . Alcohol use 2.4 oz/week  4 Standard drinks or equivalent per week     Comment: wine 3-4/weekly  . Drug use: No  . Sexual activity: Not on file   Other Topics Concern  . Not on file   Social History Narrative  . No narrative on file   Social History   Social History Narrative  . No narrative on file     ROS: Negative.     PE: HEENT: Negative. Lungs: Clear. Cardio: RR. Robert Bellow 03/10/2016   Assessment/Plan:  Proceed with planned cholecystectomy.

## 2016-03-10 NOTE — Discharge Instructions (Signed)

## 2016-03-10 NOTE — Anesthesia Preprocedure Evaluation (Signed)
Anesthesia Evaluation  Patient identified by MRN, date of birth, ID band Patient awake    Reviewed: Allergy & Precautions, H&P , NPO status , Patient's Chart, lab work & pertinent test results  History of Anesthesia Complications (+) Family history of anesthesia reaction and history of anesthetic complications  Airway Mallampati: III  TM Distance: >3 FB Neck ROM: full    Dental  (+) Poor Dentition, Chipped   Pulmonary neg shortness of breath, asthma ,    Pulmonary exam normal breath sounds clear to auscultation       Cardiovascular Exercise Tolerance: Good (-) angina(-) Past MI and (-) DOE Normal cardiovascular exam+ dysrhythmias + Valvular Problems/Murmurs MVP  Rhythm:regular Rate:Normal     Neuro/Psych  Headaches, negative psych ROS   GI/Hepatic Neg liver ROS, GERD  Controlled,  Endo/Other  negative endocrine ROS  Renal/GU negative Renal ROS  negative genitourinary   Musculoskeletal   Abdominal   Peds  Hematology negative hematology ROS (+)   Anesthesia Other Findings Past Medical History: No date: Anemia No date: Asthma No date: Dysrhythmia     Comment: flutters occ  No date: Family history of adverse reaction to anesthes*     Comment: pts mom-n/v No date: GERD (gastroesophageal reflux disease) No date: Headache No date: IBS (irritable bowel syndrome) No date: Mitral valve prolapse  Past Surgical History: 2011: BREAST BIOPSY Right     Comment: neg/ ultrasound guided core 2011: BREAST BIOPSY Right 2011: BREAST CYST ASPIRATION Right     Comment: neg 2007: COLONOSCOPY 2007: COLONOSCOPY WITH ESOPHAGOGASTRODUODENOSCOPY (E* 2011: HERNIA REPAIR     Comment: umbilical  BMI    Body Mass Index:  25.82 kg/m      Reproductive/Obstetrics negative OB ROS                             Anesthesia Physical Anesthesia Plan  ASA: III  Anesthesia Plan: General and General ETT    Post-op Pain Management:    Induction:   Airway Management Planned:   Additional Equipment:   Intra-op Plan:   Post-operative Plan:   Informed Consent: I have reviewed the patients History and Physical, chart, labs and discussed the procedure including the risks, benefits and alternatives for the proposed anesthesia with the patient or authorized representative who has indicated his/her understanding and acceptance.   Dental Advisory Given  Plan Discussed with: Anesthesiologist, CRNA and Surgeon  Anesthesia Plan Comments:         Anesthesia Quick Evaluation

## 2016-03-10 NOTE — Op Note (Signed)
Preoperative diagnosis: Chronic cholecystitis and cholelithiasis.  Postoperative diagnosis: Same.  Operative procedure: Laparoscopic cholecystectomy with intraoperative cholangiograms.  Operating surgeon: Ollen Bowl, M.D.  Anesthesia: Gen. endotracheal.  Estimated blood loss: Less than 5 mL.  Clinical note: This 50 year old woman has had recurrent bouts of upper abdominal pain with radiation in the back consistent with biliary colic. She is admitted at this time for elective cholecystectomy.  Operative note: With the patient under adequate endotracheal general anesthesia the abdomen was prepped with ChloraPrep and draped. Due to previous umbilical hernia repair with prosthetic mesh a 5 mm step port was placed in the left upper quadrant just below the costal margin in the midclavicular line. The areas data was placed and after aspiration was clear the abdomen was insufflated with CO2 a 10 mmHg pressure. The 5 m port was expanded. There was no evidence of injury from initial port placement. The stomach was noted to be moderately distended and an oral gastric tube was passed by the nurse anesthetist with resolution. There were no adhesions at the site of the previous umbilical hernia repair. It was elected to make an incision below the anticipated location of the previously placed mesh using the original incision at the umbilicus. The skin was incised sharply, varies needle passed and a 10 mm Port expanded. The patient was placed in the reverse Trendelenburg position and rolled to the left. An 11 mm XL port was placed in the epigastrium under direct vision and 2-5 mm Step ports placed laterally under direct vision. There were 2 lesions of the duodenum to the lower edge of the gallbladder and these were taken down with cautery and sharp dissection. The neck of the gallbladder was cleared. The patient had received dexamethasone prior to skin incision and additional premedication was not felt necessary  for the small volume of contrast expected to be used in the biliary tree. A Kumar clamp was placed and 20 mL of one half strength Conray 60 was used. There is prompt filling of the cystic duct and common bile duct which was quite small. Free flow to the duodenum. Minimal reflux in the common hepatic duct. The cystic artery was divided between hemoclips. The neck of the gallbladder was cleared to confirm no additional ductal structures. The cystic duct was doubly clipped and divided. The gallbladder was removed from the liver bed making use of hook cautery dissection. This was then delivered into an Endo Catch bag anticipating a large stone that would require fracture for removal. The gallbladder was delivered to the infraumbilical port site and extracted by crushing the stone. The infraumbilical incision was closed with a single 0 Prolene suture passed with the "cold" and suture passer. The 10 mm port was replaced and inspection of the right upper quadrant completed. Good hemostasis was noted. The area was irrigated with lactated Ringer's solution. The abdomen was then desufflated and ports removed under direct vision. The fascial suture at the infra-umbilical site was tied down with good closure of the fascial defect. Skin incisions were closed with 4-0 Vicryl septic sutures. Benzoin, Steri-Strips, Telfa and Tegaderm dressings were applied.  The patient tolerated the procedure well and was taken recovery in stable condition.

## 2016-03-11 ENCOUNTER — Ambulatory Visit: Payer: BC Managed Care – PPO | Admitting: General Surgery

## 2016-03-11 NOTE — Anesthesia Postprocedure Evaluation (Signed)
Anesthesia Post Note  Patient: Leslie Lowe  Procedure(s) Performed: Procedure(s) (LRB): LAPAROSCOPIC CHOLECYSTECTOMY WITH INTRAOPERATIVE CHOLANGIOGRAM (N/A)  Patient location during evaluation: PACU Anesthesia Type: General Level of consciousness: awake and alert Pain management: pain level controlled Vital Signs Assessment: post-procedure vital signs reviewed and stable Respiratory status: spontaneous breathing, nonlabored ventilation, respiratory function stable and patient connected to nasal cannula oxygen Cardiovascular status: blood pressure returned to baseline and stable Postop Assessment: no signs of nausea or vomiting Anesthetic complications: no    Last Vitals:  Vitals:   03/10/16 1516 03/10/16 1706  BP: 132/76 133/87  Pulse: 66 70  Resp: 14   Temp: 36.4 C     Last Pain:  Vitals:   03/10/16 1516  TempSrc:   PainSc: 6                  Precious Haws Fender Herder

## 2016-03-12 ENCOUNTER — Encounter: Payer: Self-pay | Admitting: General Surgery

## 2016-03-12 LAB — SURGICAL PATHOLOGY

## 2016-03-14 ENCOUNTER — Telehealth: Payer: Self-pay | Admitting: *Deleted

## 2016-03-14 NOTE — Telephone Encounter (Signed)
Patient called in and states she feels bloated . She is not taking any pain meds.Just aleve as needed.  No chills and fevers. She states she was constipated and has not moved her bowels since surgery. She tried using a over the counter laxative yesterday and today is she having some diarrhea, moved her bowels three times this morning. Advised her to keep using the heating pad. To call the offices with any other questions or new problems.

## 2016-03-15 ENCOUNTER — Telehealth: Payer: Self-pay | Admitting: General Surgery

## 2016-03-15 NOTE — Telephone Encounter (Signed)
Follow up to her call from yesterday. Improvement in bloating (most evident at the end of the day) after multiple BM's with laxative. Has IBS history, and unclear if persistent belching related to that rather than surgery. Reports she has an appetite, but is going slow on diet resumption. No N/V. No fever/ chills or true abdominal pain. Will plan on f/u as scheduled August 23 unless something changes.

## 2016-03-19 ENCOUNTER — Ambulatory Visit (INDEPENDENT_AMBULATORY_CARE_PROVIDER_SITE_OTHER): Payer: BC Managed Care – PPO | Admitting: General Surgery

## 2016-03-19 ENCOUNTER — Encounter: Payer: Self-pay | Admitting: General Surgery

## 2016-03-19 VITALS — BP 130/80 | HR 78 | Resp 12 | Ht 66.0 in | Wt 157.0 lb

## 2016-03-19 DIAGNOSIS — K8020 Calculus of gallbladder without cholecystitis without obstruction: Secondary | ICD-10-CM

## 2016-03-19 NOTE — Progress Notes (Signed)
Patient ID: Leslie Lowe, female   DOB: [birth year], 50 y.o.   MRN: [id number]  Chief Complaint  Patient presents with  . Routine Post Op    gallbladder     HPI Leslie Lowe is a 50 y.o. female here today for her post op gallbladder surgery done on 03/10/16. Patient states she is doing Better each week. She had some soreness last week that improved over time. No diarrhea.  HPI  Past Medical History:  Diagnosis Date  . Anemia   . Asthma   . Dysrhythmia    flutters occ   . Family history of adverse reaction to anesthesia    pts mom-n/v  . GERD (gastroesophageal reflux disease)   . Headache   . IBS (irritable bowel syndrome)   . Mitral valve prolapse     Past Surgical History:  Procedure Laterality Date  . BREAST BIOPSY Right 2011   neg/ ultrasound guided core  . BREAST BIOPSY Right 2011  . BREAST CYST ASPIRATION Right 2011   neg  . CHOLECYSTECTOMY N/A 03/10/2016   Procedure: LAPAROSCOPIC CHOLECYSTECTOMY WITH INTRAOPERATIVE CHOLANGIOGRAM;  Surgeon: Robert Bellow, MD;  Location: [facility] ORS;  Service: General;  Laterality: N/A;  . COLONOSCOPY  2007  . COLONOSCOPY WITH ESOPHAGOGASTRODUODENOSCOPY (EGD)  2007  . HERNIA REPAIR  [id number]   umbilical    Family History  Problem Relation Age of Onset  . Breast cancer Maternal Aunt 76  . Breast cancer Maternal Grandmother 45  . Diabetes Brother   . Hypertension Mother   . Asthma Mother     Social History Social History  Substance Use Topics  . Smoking status: Never Smoker  . Smokeless tobacco: Never Used  . Alcohol use 2.4 oz/week    4 Standard drinks or equivalent per week     Comment: wine 3-4/weekly    Allergies  Allergen Reactions  . Erythromycin Ethylsuccinate Nausea And Vomiting  . Promethazine Nausea And Vomiting  . Shellfish Allergy Itching and Nausea And Vomiting    wheezing  . Sulfa Antibiotics Hives    Current Outpatient Prescriptions  Medication Sig Dispense Refill  . albuterol (PROAIR HFA) 108 (90 Base)  MCG/ACT inhaler Inhale 1 puff into the lungs every 6 (six) hours as needed for shortness of breath.     Marland Kitchen amitriptyline (ELAVIL) 10 MG tablet Take 10 mg by mouth at bedtime.   1  . amoxicillin (AMOXIL) 500 MG tablet Take 500 mg by mouth as needed (take 4 tablets prior to dental procedure).    Marland Kitchen aspirin-acetaminophen-caffeine (EXCEDRIN MIGRAINE) 250-250-65 MG tablet Take 1 tablet by mouth every 6 (six) hours as needed for headache or migraine.     . Budesonide 90 MCG/ACT inhaler Inhale 1 puff into the lungs at bedtime.     . Cetirizine HCl 10 MG CAPS Take 10 mg by mouth every morning.     Marland Kitchen EPINEPHrine 0.3 mg/0.3 mL IJ SOAJ injection Inject 0.3 mg into the skin once.   12  . fluticasone (FLONASE) 50 MCG/ACT nasal spray Place 1 spray into both nostrils daily.     . Iron-Vitamin C (VITRON-C) 65-125 MG TABS Take 1 tablet by mouth 2 (two) times a week.    . naproxen sodium (ANAPROX) 220 MG tablet Take 220 mg by mouth 2 (two) times daily as needed (pain).     . pseudoephedrine-guaifenesin (MUCINEX D) 60-600 MG 12 hr tablet Take 1 tablet by mouth as needed for congestion.    Marland Kitchen PULMICORT FLEXHALER 180  MCG/ACT inhaler Inhale 2 puffs into the lungs 2 (two) times daily as needed (allergies).   11   No current facility-administered medications for this visit.     Review of Systems Review of Systems  Constitutional: Negative.   Respiratory: Negative.   Cardiovascular: Negative.     Blood pressure 130/80, pulse 78, resp. rate 12, height 5\' 6"  (1.676 m), weight 157 lb (71.2 kg), last menstrual period 03/02/2016.  Physical Exam Physical Exam  Constitutional: She is oriented to person, place, and time. She appears well-developed and well-nourished.  Cardiovascular: Normal rate, regular rhythm and normal heart sounds.   Pulmonary/Chest: Effort normal and breath sounds normal.  Abdominal: Soft. Normal appearance and bowel sounds are normal. There is no tenderness.    Port sites are clean and healing  well.   Neurological: She is alert and oriented to person, place, and time.  Skin: Skin is warm and dry.    Data Reviewed A. GALLBLADDER; LAPAROSCOPIC CHOLECYSTECTOMY:  - CHRONIC CHOLECYSTITIS WITH CHOLELITHIASIS.  - CHOLESTEROLOSIS.  - NEGATIVE FOR MALIGNANCY. A. GALLBLADDER; LAPAROSCOPIC CHOLECYSTECTOMY:  - CHRONIC CHOLECYSTITIS WITH CHOLELITHIASIS.  - CHOLESTEROLOSIS.  - NEGATIVE FOR MALIGNANCY.   Assessment    Slightly slower than usual recovery, but steady progress.    Plan    Anticipate resolution of the musculoskeletal symptoms over time. She'll continue to use Aleve twice a day as needed for comfort. She was encouraged to continue her PPI while on Aleve to minimize GI upset.  She can increase her diet as tolerated.   Patient to return as needed.  This information has been scribed by Karie Fetch RN, BSN,BC.    Robert Bellow 03/19/2016, 10:59 AM

## 2016-03-19 NOTE — Patient Instructions (Signed)
Return as needed

## 2016-04-04 ENCOUNTER — Encounter: Payer: Self-pay | Admitting: General Surgery

## 2016-06-18 ENCOUNTER — Other Ambulatory Visit: Payer: Self-pay | Admitting: Obstetrics and Gynecology

## 2016-06-18 DIAGNOSIS — Z1231 Encounter for screening mammogram for malignant neoplasm of breast: Secondary | ICD-10-CM

## 2016-07-29 ENCOUNTER — Ambulatory Visit
Admission: RE | Admit: 2016-07-29 | Discharge: 2016-07-29 | Disposition: A | Discharge status: Home / Self Care | Payer: BC Managed Care – PPO | Source: Ambulatory Visit | Attending: Obstetrics and Gynecology | Admitting: Obstetrics and Gynecology

## 2016-07-29 DIAGNOSIS — Z1231 Encounter for screening mammogram for malignant neoplasm of breast: Secondary | ICD-10-CM | POA: Diagnosis not present

## 2017-07-29 ENCOUNTER — Other Ambulatory Visit: Payer: Self-pay | Admitting: Obstetrics and Gynecology

## 2017-07-29 DIAGNOSIS — Z1231 Encounter for screening mammogram for malignant neoplasm of breast: Secondary | ICD-10-CM

## 2017-09-01 ENCOUNTER — Ambulatory Visit
Admission: RE | Admit: 2017-09-01 | Discharge: 2017-09-01 | Disposition: A | Discharge status: Home / Self Care | Payer: BC Managed Care – PPO | Source: Ambulatory Visit | Attending: Obstetrics and Gynecology | Admitting: Obstetrics and Gynecology

## 2017-09-01 DIAGNOSIS — Z1231 Encounter for screening mammogram for malignant neoplasm of breast: Secondary | ICD-10-CM | POA: Insufficient documentation

## 2018-08-26 ENCOUNTER — Other Ambulatory Visit: Payer: Self-pay | Admitting: Obstetrics and Gynecology

## 2018-08-26 DIAGNOSIS — Z1231 Encounter for screening mammogram for malignant neoplasm of breast: Secondary | ICD-10-CM

## 2018-09-28 ENCOUNTER — Ambulatory Visit
Admission: RE | Admit: 2018-09-28 | Discharge: 2018-09-28 | Disposition: A | Discharge status: Home / Self Care | Payer: BC Managed Care – PPO | Source: Ambulatory Visit | Attending: Obstetrics and Gynecology | Admitting: Obstetrics and Gynecology

## 2018-09-28 DIAGNOSIS — Z1231 Encounter for screening mammogram for malignant neoplasm of breast: Secondary | ICD-10-CM | POA: Diagnosis present

## 2018-09-30 ENCOUNTER — Other Ambulatory Visit: Payer: Self-pay | Admitting: Obstetrics and Gynecology

## 2018-09-30 DIAGNOSIS — N6310 Unspecified lump in the right breast, unspecified quadrant: Secondary | ICD-10-CM

## 2018-09-30 DIAGNOSIS — R928 Other abnormal and inconclusive findings on diagnostic imaging of breast: Secondary | ICD-10-CM

## 2018-10-06 ENCOUNTER — Ambulatory Visit
Admission: RE | Admit: 2018-10-06 | Discharge: 2018-10-06 | Disposition: A | Discharge status: Home / Self Care | Payer: BC Managed Care – PPO | Source: Ambulatory Visit | Attending: Obstetrics and Gynecology | Admitting: Obstetrics and Gynecology

## 2018-10-06 ENCOUNTER — Other Ambulatory Visit: Payer: Self-pay

## 2018-10-06 DIAGNOSIS — R928 Other abnormal and inconclusive findings on diagnostic imaging of breast: Secondary | ICD-10-CM | POA: Insufficient documentation

## 2018-10-06 DIAGNOSIS — N6310 Unspecified lump in the right breast, unspecified quadrant: Secondary | ICD-10-CM | POA: Insufficient documentation

## 2019-08-24 ENCOUNTER — Other Ambulatory Visit: Payer: Self-pay | Admitting: Obstetrics and Gynecology

## 2019-08-24 DIAGNOSIS — Z1231 Encounter for screening mammogram for malignant neoplasm of breast: Secondary | ICD-10-CM

## 2019-09-19 ENCOUNTER — Ambulatory Visit
Admission: RE | Admit: 2019-09-19 | Discharge: 2019-09-19 | Disposition: A | Discharge status: Home / Self Care | Payer: BC Managed Care – PPO | Source: Ambulatory Visit | Attending: Obstetrics and Gynecology | Admitting: Obstetrics and Gynecology

## 2019-09-19 DIAGNOSIS — Z1231 Encounter for screening mammogram for malignant neoplasm of breast: Secondary | ICD-10-CM | POA: Diagnosis present

## 2019-12-09 IMAGING — MG DIGITAL DIAGNOSTIC UNILATERAL RIGHT MAMMOGRAM WITH TOMO AND CAD
6 series · 6 of 18 positions shown · non-contrast
Comparison: Previous exam(s).

CLINICAL DATA: 53-year-old female for further evaluation of
possible RIGHT breast mass on screening mammogram.

EXAM:
DIGITAL DIAGNOSTIC RIGHT MAMMOGRAM WITH TOMO
ULTRASOUND RIGHT BREAST

[R CC synth-2D (1 of 2)]
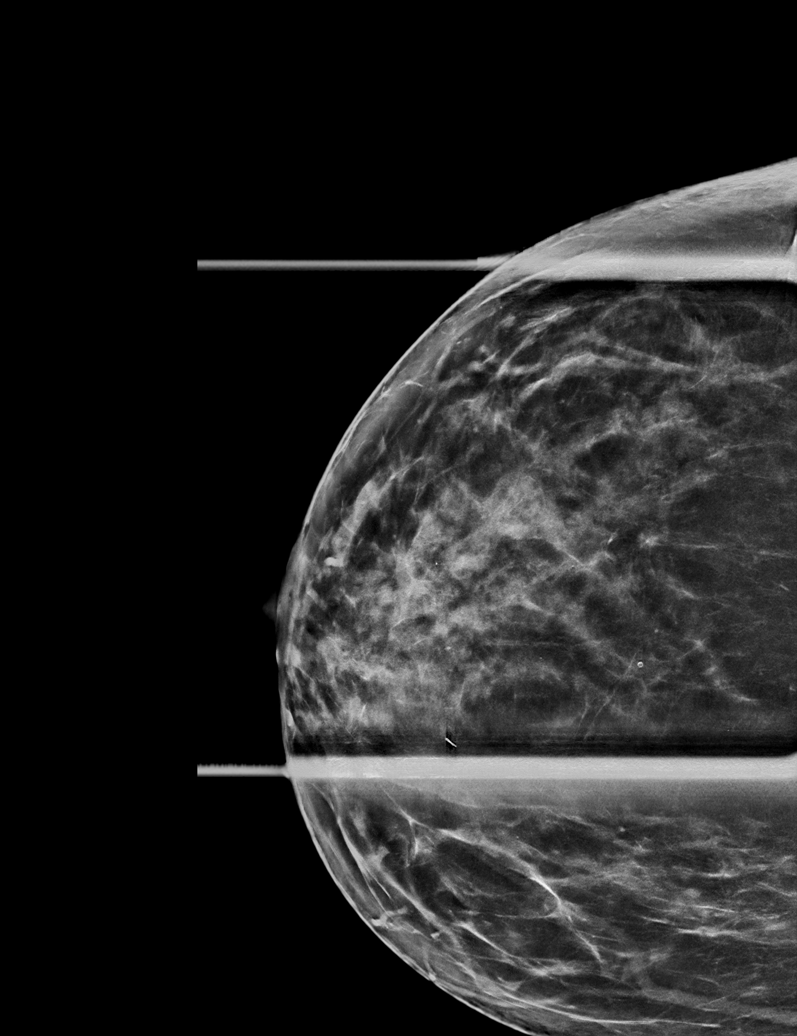

[R CC synth-2D (2 of 2)]
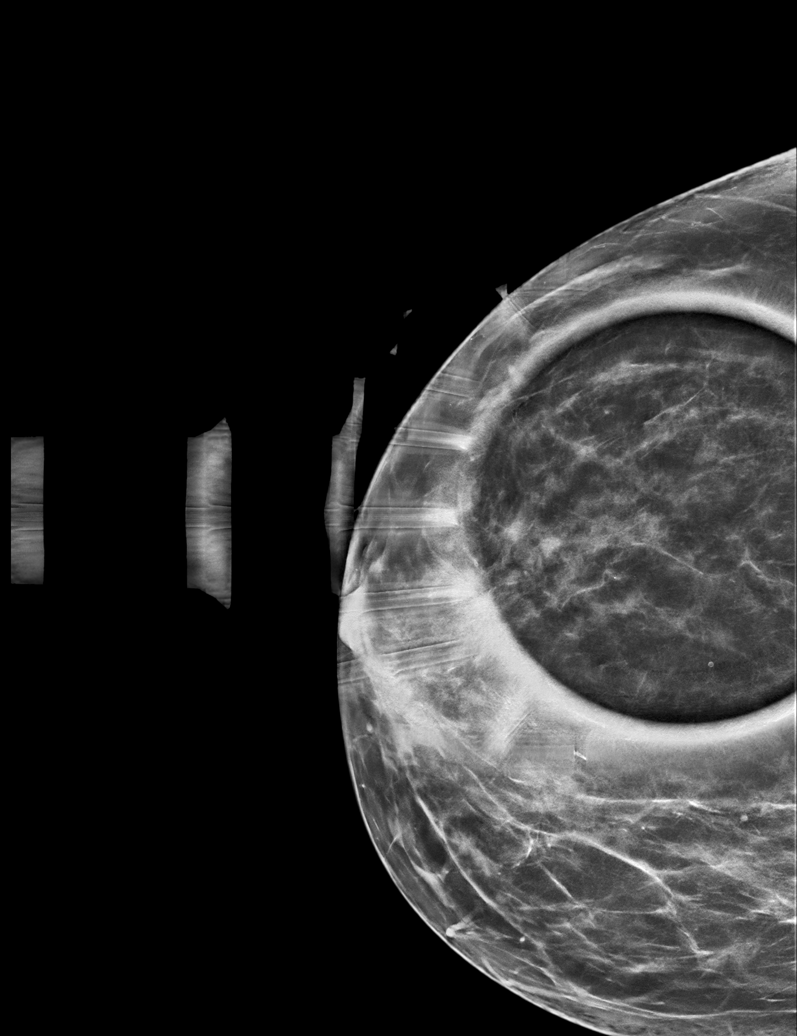

[R MLO synth-2D]
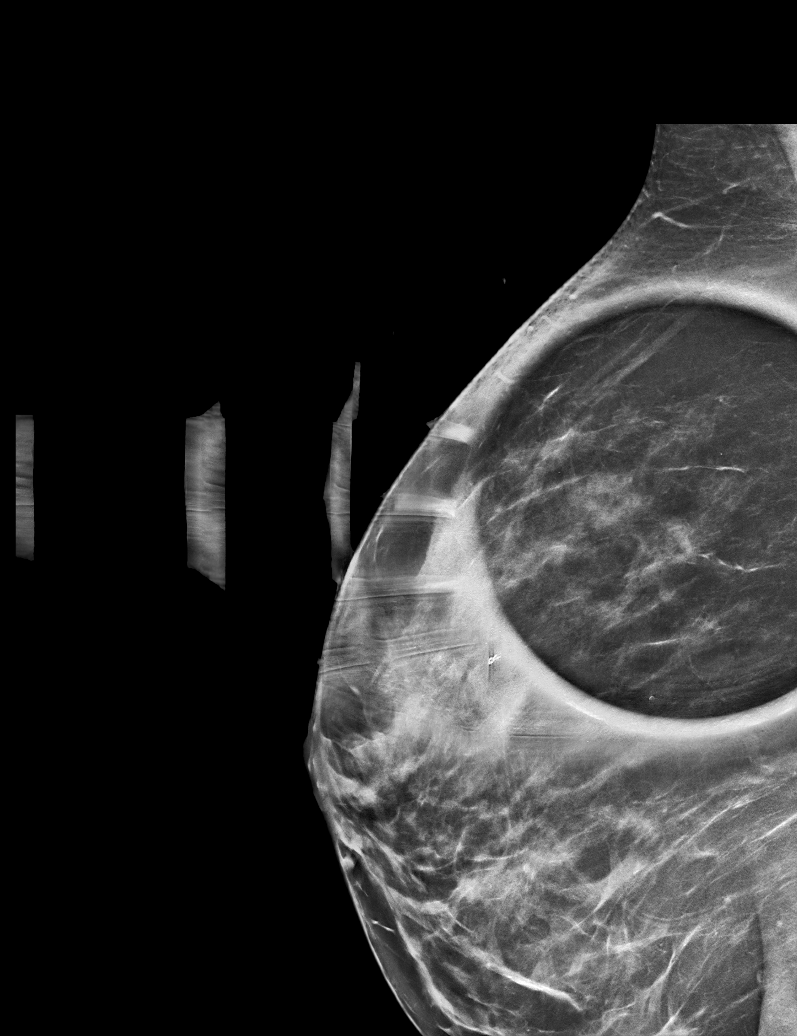

[R CC tomo (1 of 2) · tomo slice 33/66.0]
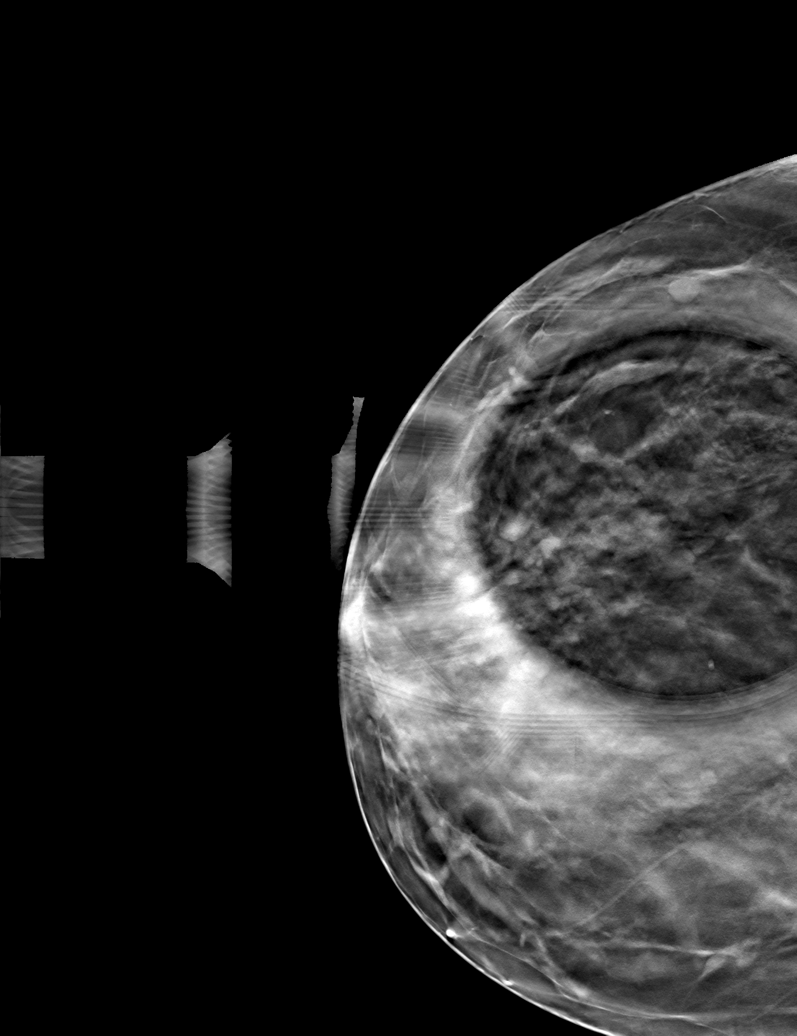

[R MLO tomo · tomo slice 37/73.0]
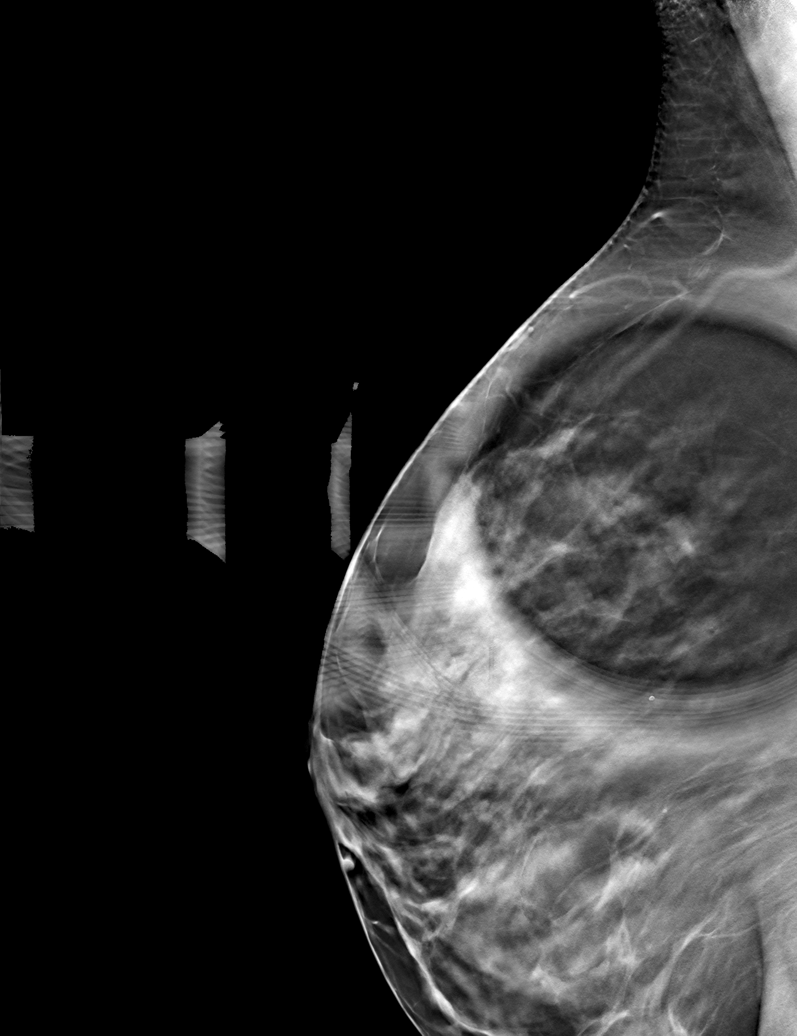

[R CC tomo (2 of 2) · tomo slice 38/75.0]
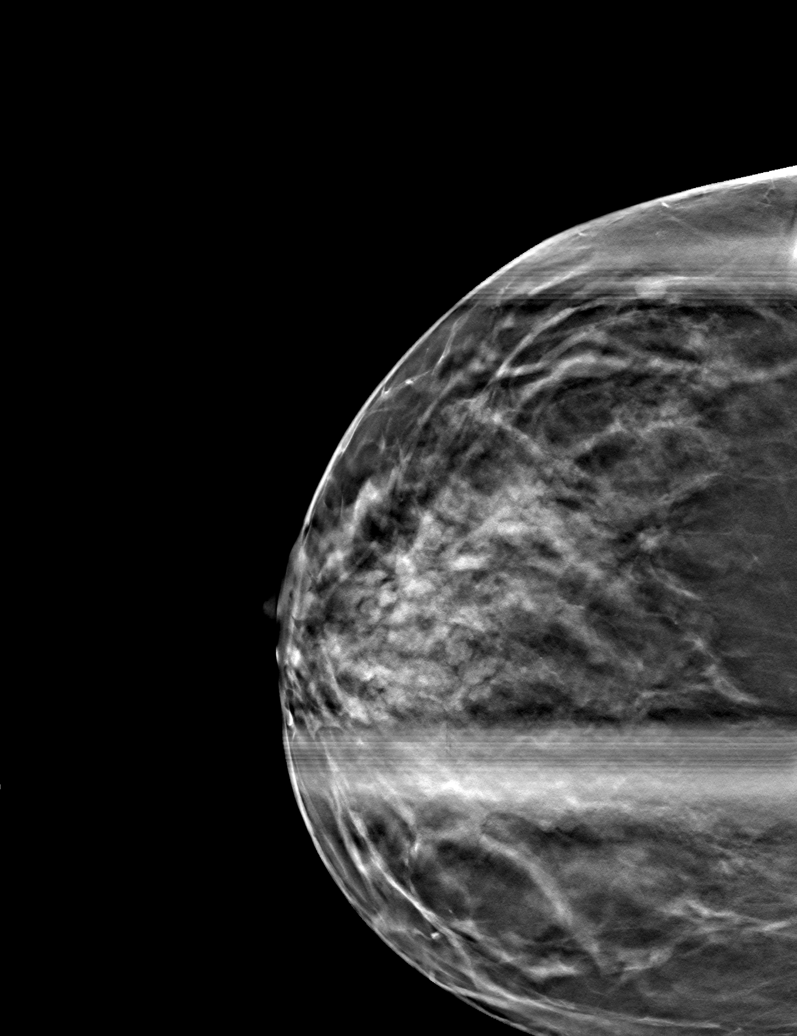

[6 of 18 positions shown; findings below may reference images not displayed]

ACR Breast Density Category c: The breast tissue is heterogeneously
dense, which may obscure small masses.
FINDINGS: 2D/3D spot compression views of the RIGHT breast demonstrate a
persistent circumscribed oval mass within the UPPER-OUTER RIGHT
breast.

Targeted ultrasound is performed, showing a 1.4 x 0.6 x 1 cm benign
simple cyst at the 10 o'clock position the RIGHT breast 4 cm from
the nipple, corresponding to the screening study finding.
IMPRESSION: Benign cyst within the UPPER-OUTER RIGHT breast corresponding to the
screening study finding.

RECOMMENDATION:
Bilateral screening mammogram in 1 year

I have discussed the findings and recommendations with the patient.
Results were also provided in writing at the conclusion of the
visit. If applicable, a reminder letter will be sent to the patient
regarding the next appointment.

BI-RADS CATEGORY  2: Benign.

## 2019-12-09 IMAGING — US ULTRASOUND RIGHT BREAST LIMITED
1 series · 6 of 6 positions shown · non-contrast
Comparison: Previous exam(s).

CLINICAL DATA: 53-year-old female for further evaluation of
possible RIGHT breast mass on screening mammogram.

EXAM:
DIGITAL DIAGNOSTIC RIGHT MAMMOGRAM WITH TOMO
ULTRASOUND RIGHT BREAST

[Series 1: ultrasound right breast limited · 0.05mm/px · 6 of 6 slices shown]
[im 1/6]
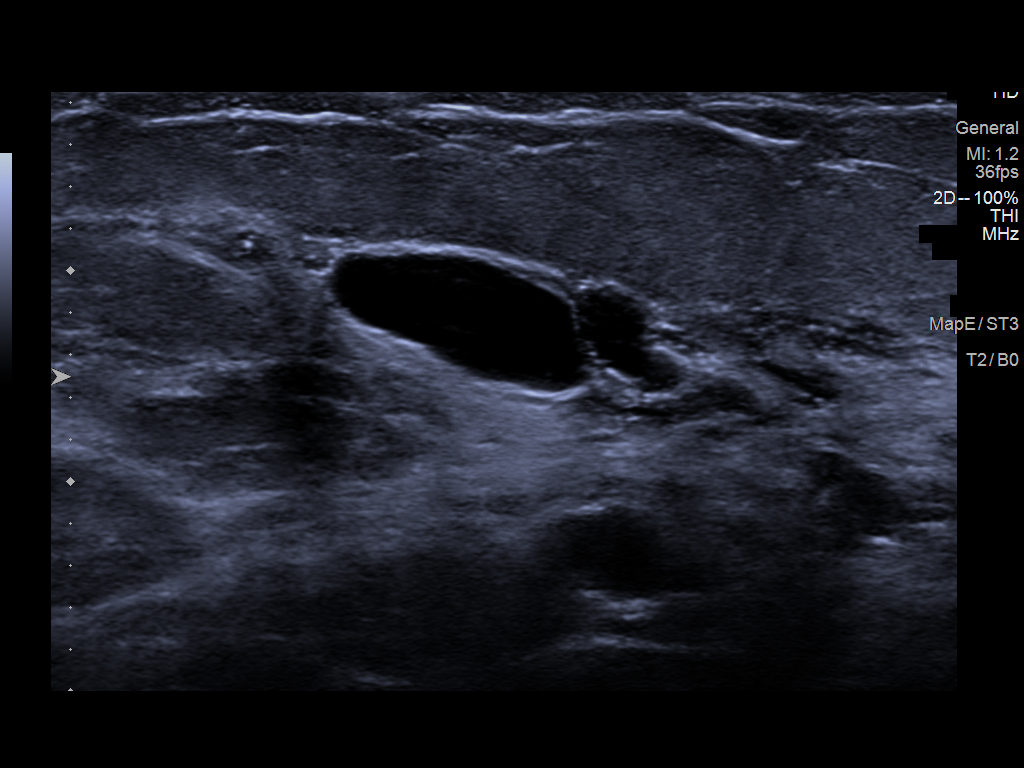
[im 2/6]
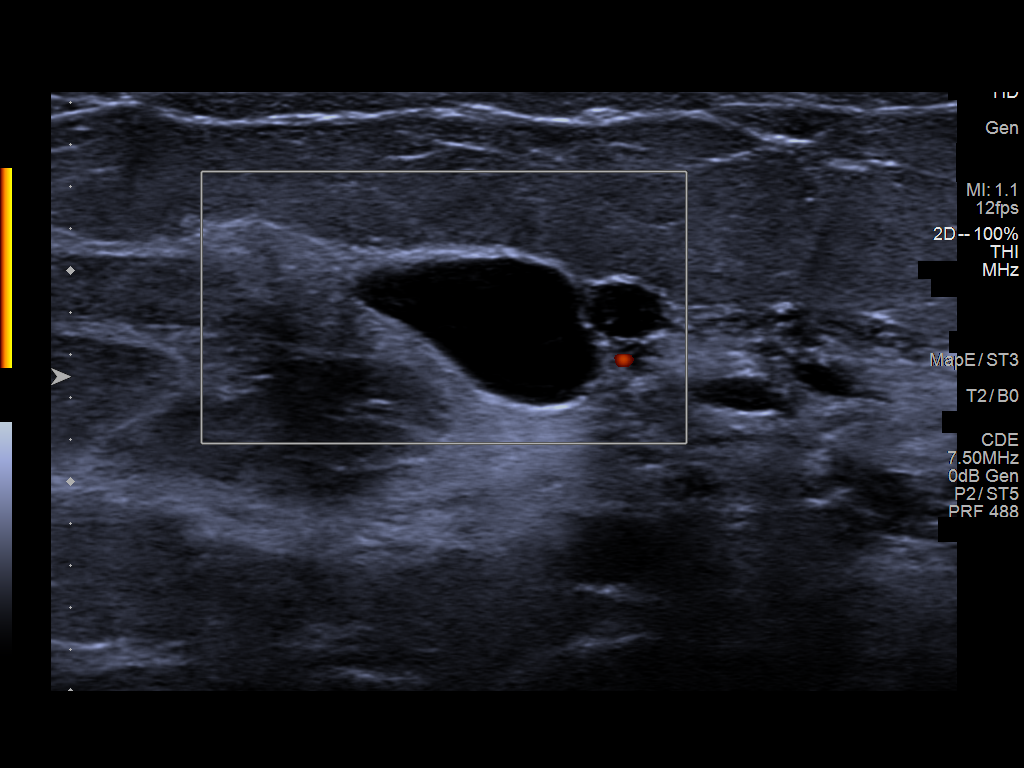
[im 3/6]
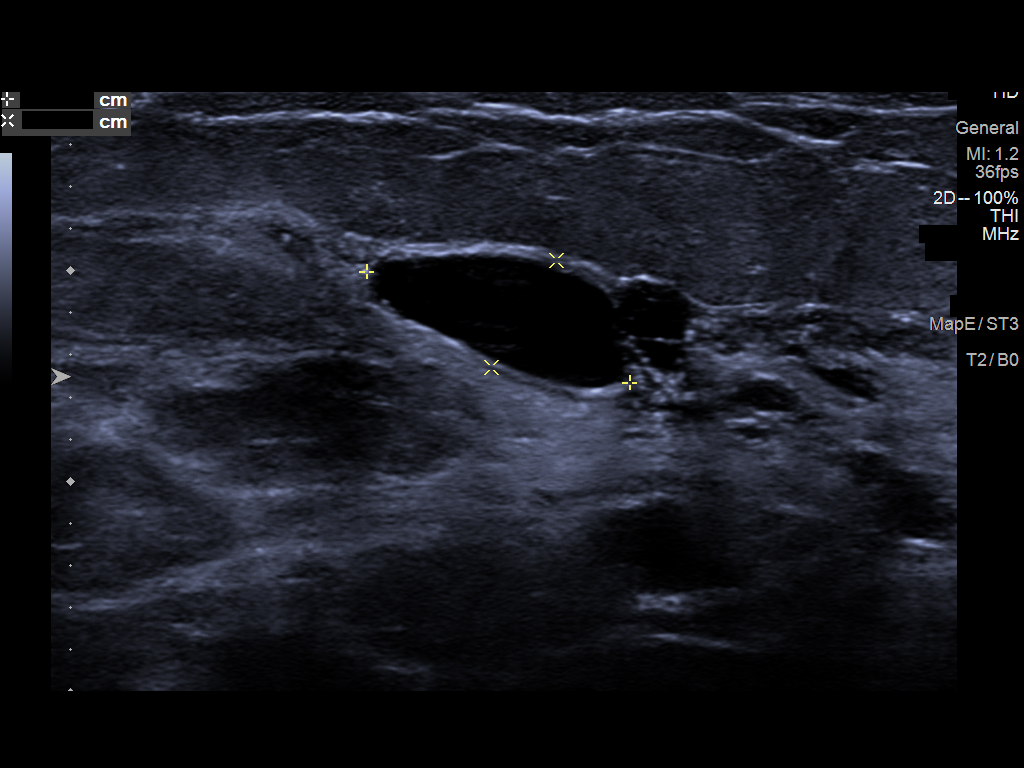
[im 4/6]
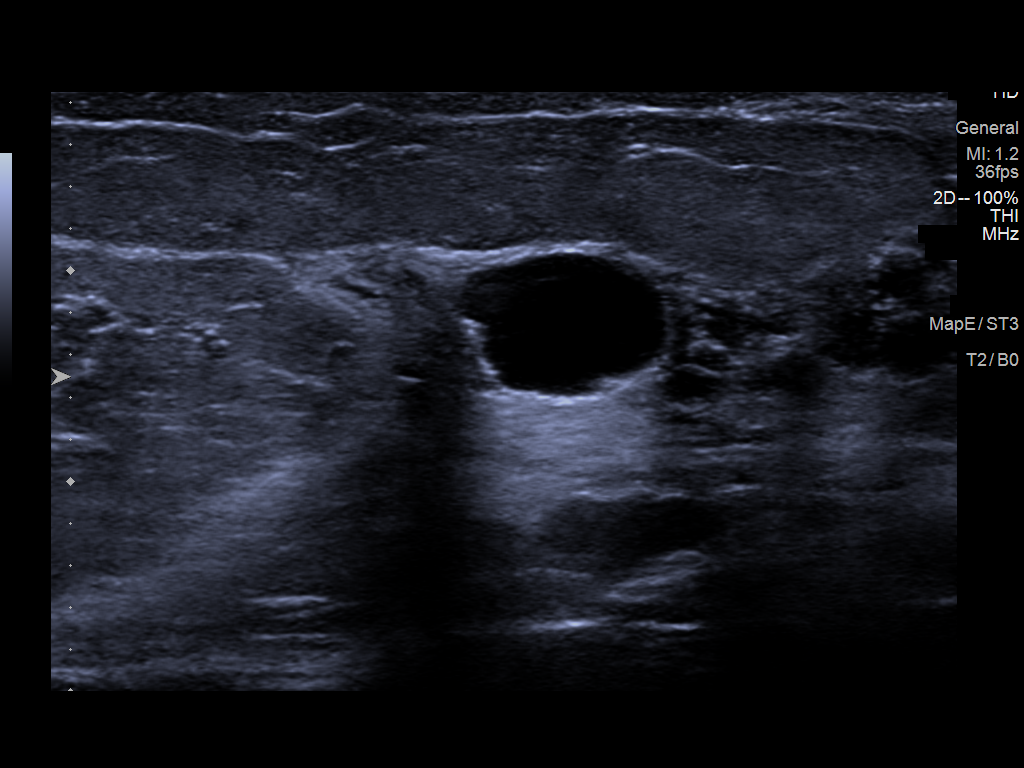
[im 5/6]
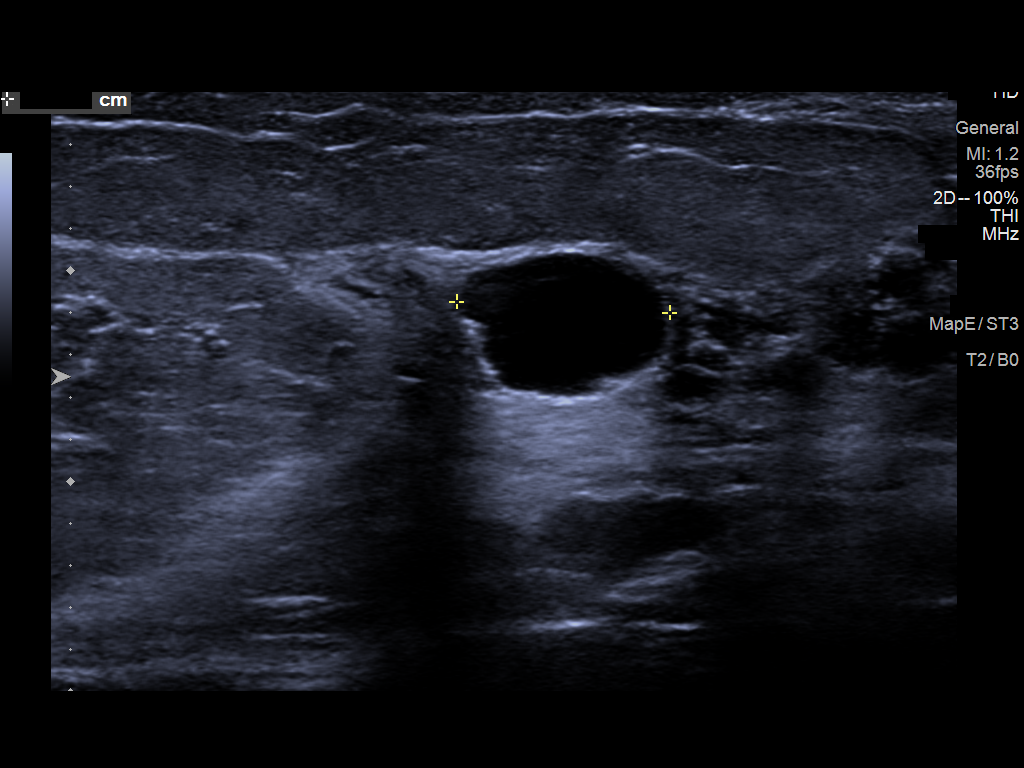
[im 6/6]
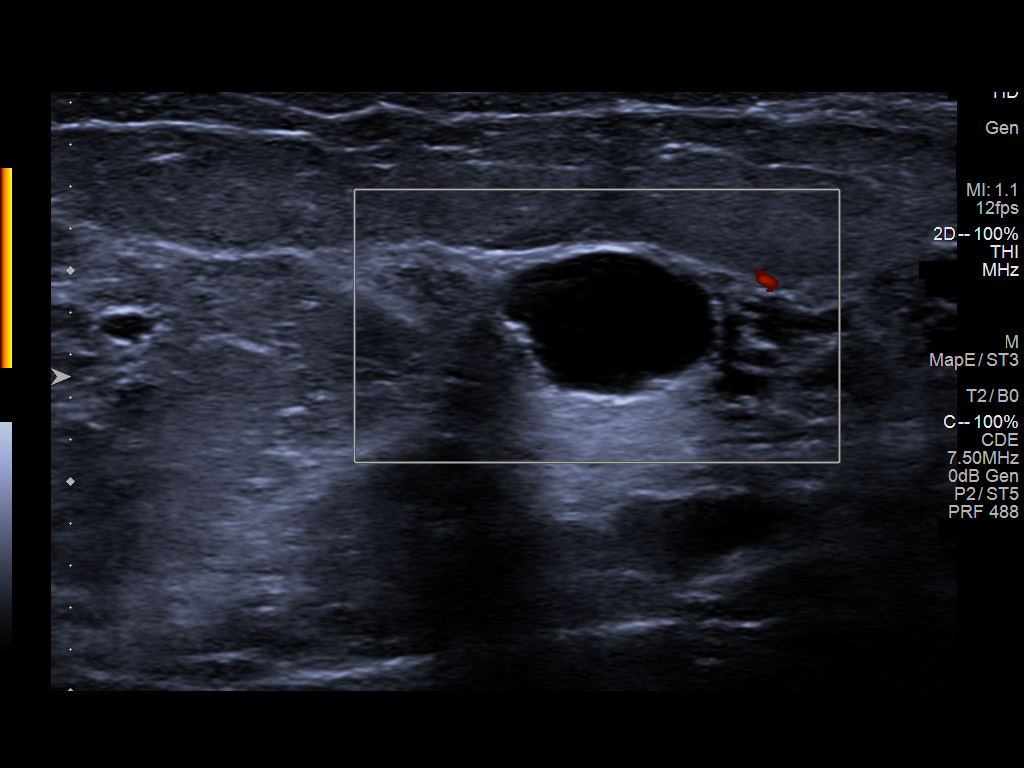

[6 of 6 positions shown; findings below may reference images not displayed]

ACR Breast Density Category c: The breast tissue is heterogeneously
dense, which may obscure small masses.
FINDINGS: 2D/3D spot compression views of the RIGHT breast demonstrate a
persistent circumscribed oval mass within the UPPER-OUTER RIGHT
breast.

Targeted ultrasound is performed, showing a 1.4 x 0.6 x 1 cm benign
simple cyst at the 10 o'clock position the RIGHT breast 4 cm from
the nipple, corresponding to the screening study finding.
IMPRESSION: Benign cyst within the UPPER-OUTER RIGHT breast corresponding to the
screening study finding.

RECOMMENDATION:
Bilateral screening mammogram in 1 year

I have discussed the findings and recommendations with the patient.
Results were also provided in writing at the conclusion of the
visit. If applicable, a reminder letter will be sent to the patient
regarding the next appointment.

BI-RADS CATEGORY  2: Benign.

## 2020-02-20 ENCOUNTER — Other Ambulatory Visit: Payer: Self-pay | Admitting: Student

## 2020-02-20 DIAGNOSIS — M7581 Other shoulder lesions, right shoulder: Secondary | ICD-10-CM

## 2020-02-20 DIAGNOSIS — M7521 Bicipital tendinitis, right shoulder: Secondary | ICD-10-CM

## 2020-03-06 ENCOUNTER — Ambulatory Visit
Admission: RE | Admit: 2020-03-06 | Discharge: 2020-03-06 | Disposition: A | Discharge status: Home / Self Care | Payer: BC Managed Care – PPO | Source: Ambulatory Visit | Attending: Student | Admitting: Student

## 2020-03-06 ENCOUNTER — Other Ambulatory Visit: Payer: Self-pay

## 2020-03-06 DIAGNOSIS — M7581 Other shoulder lesions, right shoulder: Secondary | ICD-10-CM | POA: Diagnosis present

## 2020-03-06 DIAGNOSIS — M7521 Bicipital tendinitis, right shoulder: Secondary | ICD-10-CM | POA: Diagnosis present

## 2020-05-22 ENCOUNTER — Ambulatory Visit: Payer: BC Managed Care – PPO | Admitting: Dermatology

## 2020-06-15 ENCOUNTER — Other Ambulatory Visit: Payer: Self-pay

## 2020-06-15 ENCOUNTER — Ambulatory Visit (INDEPENDENT_AMBULATORY_CARE_PROVIDER_SITE_OTHER): Payer: BC Managed Care – PPO | Admitting: Psychiatry

## 2020-06-15 DIAGNOSIS — F320 Major depressive disorder, single episode, mild: Secondary | ICD-10-CM | POA: Diagnosis not present

## 2020-06-15 DIAGNOSIS — Z636 Dependent relative needing care at home: Secondary | ICD-10-CM

## 2020-06-15 DIAGNOSIS — F411 Generalized anxiety disorder: Secondary | ICD-10-CM | POA: Diagnosis not present
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
## 2020-06-27 ENCOUNTER — Ambulatory Visit: Payer: BC Managed Care – PPO | Admitting: Psychiatry

## 2020-07-10 ENCOUNTER — Other Ambulatory Visit: Payer: Self-pay

## 2020-07-10 ENCOUNTER — Ambulatory Visit (INDEPENDENT_AMBULATORY_CARE_PROVIDER_SITE_OTHER): Payer: BC Managed Care – PPO | Admitting: Psychiatry

## 2020-07-10 DIAGNOSIS — F320 Major depressive disorder, single episode, mild: Secondary | ICD-10-CM

## 2020-07-10 DIAGNOSIS — Z636 Dependent relative needing care at home: Secondary | ICD-10-CM | POA: Diagnosis not present

## 2020-07-10 DIAGNOSIS — F411 Generalized anxiety disorder: Secondary | ICD-10-CM
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
## 2020-07-24 ENCOUNTER — Ambulatory Visit (INDEPENDENT_AMBULATORY_CARE_PROVIDER_SITE_OTHER): Payer: BC Managed Care – PPO | Admitting: Psychiatry

## 2020-07-24 ENCOUNTER — Other Ambulatory Visit: Payer: Self-pay

## 2020-07-24 DIAGNOSIS — F329 Major depressive disorder, single episode, unspecified: Secondary | ICD-10-CM | POA: Diagnosis not present

## 2020-07-24 DIAGNOSIS — F411 Generalized anxiety disorder: Secondary | ICD-10-CM | POA: Diagnosis not present

## 2020-07-24 NOTE — Progress Notes (Signed)
Psychotherapy Progress Note [facility] [name], PhD LP  Patient ID: Leslie Lowe     MRN: [id number] Therapy format: Individual psychotherapy Date: 07/24/2020      Start: 11:08a     Stop: 11:59a     Time Spent: 51 min Location: In-person   Session narrative (presenting needs, interim history, self-report of stressors and symptoms, applications of prior therapy, status changes, and interventions made in session) Father recovering better than expected.  [employer] as safely as possible, was able to do a lot outside, for mother's sake with virus risk, balancing brother's higher caution with mother's desire to see people.  Some worry about preparing for back to school next week, dreaming of school.  Has looming responsibility of test scores -- end of year scores, first time in 4 years her class's scores will count toward the school's performance, and she was placed in her current spot in an expressed effort (by previous principal) to raise scores in reading.  Prospect of lesson plans is exhausting.  Knows she tends to dread sense of failure.  Not sure why it's so tiring.  Interpreted that worry is actually physical, muscular and brain work, and carrying dread does itself  Tire one out physically.  Articulated goals of telling the difference between reasonable and unnecessary fears, moving efficiently to decide whether or not to act on the problem or accept and cope, then humanely judging how she responds.  Probed the distinction between worrying about superiors' opinions of her and her own internal evaluation of herself and evidence for against being concerned about her performance.  Denies backlog of work to do to be prepared, though there are times when she feels like she is more just winging it in class.  Refreshed good reasons why this year will be hard to judge on student achievement and the real value of helping students more personally and helping colleagues as  substitutes get on top of their jobs, too.  Discussed externalizing her own, self-critical voice in imagination or in writing to clarify and ask herself which worries and efforts are necessary and which ones are overdone.  In keeping with her faith, pointed out God's unconditional regard for her and patient's with anything she genuinely struggles to accomplish or accept.  Therapeutic modalities: Cognitive Behavioral Therapy, Solution-Oriented/Positive Psychology and Faith-sensitive  Mental Status/Observations:  Appearance:   Casual and Neat     Behavior:  Appropriate  Motor:  Normal  Speech/Language:   Clear and Coherent  Affect:  Appropriate  Mood:  anxious and dysthymic  Thought process:  normal  Thought content:    WNL  Sensory/Perceptual disturbances:    WNL  Orientation:  Fully oriented  Attention:  Good    Concentration:  Good  Memory:  WNL  Insight:    Good  Judgment:   Good  Impulse Control:  Good   Risk Assessment: Danger to Self: No Self-injurious Behavior: No Danger to Others: No Physical Aggression / Violence: No Duty to Warn: No Access to Firearms a concern: No  Assessment of progress:  progressing  Diagnosis:   ICD-10-CM   1. Generalized anxiety disorder  F41.1   2. Major depressive episode  F32.9    Plan:  . Will practice noticing both lows and highs (thanks, accomplishments, joys...) in a day -- write if possible, acknowledge consciously . Continuing recommendations to manage sleep and other health habits for stress reduction . Continuing recommendation to journal freely to externalize worry thoughts and enable  reappraisal . Continuing recommendation to practice asking questions assertively where needed to settle worry, e.g., family or administration expectations . Other recommendations/advice as may be noted above . Continue to utilize previously learned skills ad lib . Maintain medication as prescribed and work faithfully with relevant prescriber(s) if any  changes are desired or seem indicated . Call the clinic on-call service, present to ER, or call 911 if any life-threatening psychiatric crisis Return in about 2 weeks (around 08/07/2020). . Already scheduled visit in this office 08/07/2020.  Robley Fries, PhD Marliss Czar, PhD LP Clinical Psychologist, [facility] [address] [place], [place] [address] [phone]

## 2020-07-30 ENCOUNTER — Ambulatory Visit: Payer: BC Managed Care – PPO | Admitting: Dermatology

## 2020-08-07 ENCOUNTER — Ambulatory Visit: Payer: BC Managed Care – PPO | Admitting: Psychiatry

## 2020-08-21 ENCOUNTER — Ambulatory Visit (INDEPENDENT_AMBULATORY_CARE_PROVIDER_SITE_OTHER): Payer: BC Managed Care – PPO | Admitting: Psychiatry

## 2020-08-21 ENCOUNTER — Other Ambulatory Visit: Payer: Self-pay

## 2020-08-21 DIAGNOSIS — Z636 Dependent relative needing care at home: Secondary | ICD-10-CM | POA: Diagnosis not present

## 2020-08-21 DIAGNOSIS — F320 Major depressive disorder, single episode, mild: Secondary | ICD-10-CM

## 2020-08-21 DIAGNOSIS — F411 Generalized anxiety disorder: Secondary | ICD-10-CM | POA: Diagnosis not present
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
## 2020-08-28 ENCOUNTER — Other Ambulatory Visit: Payer: Self-pay | Admitting: Obstetrics and Gynecology

## 2020-08-28 DIAGNOSIS — Z1231 Encounter for screening mammogram for malignant neoplasm of breast: Secondary | ICD-10-CM

## 2020-09-11 ENCOUNTER — Ambulatory Visit: Payer: BC Managed Care – PPO | Admitting: Dermatology

## 2020-09-11 ENCOUNTER — Encounter: Payer: Self-pay | Admitting: Dermatology

## 2020-09-11 ENCOUNTER — Other Ambulatory Visit: Payer: Self-pay

## 2020-09-11 DIAGNOSIS — L821 Other seborrheic keratosis: Secondary | ICD-10-CM

## 2020-09-11 DIAGNOSIS — D2239 Melanocytic nevi of other parts of face: Secondary | ICD-10-CM

## 2020-09-11 DIAGNOSIS — D485 Neoplasm of uncertain behavior of skin: Secondary | ICD-10-CM | POA: Diagnosis not present

## 2020-09-11 DIAGNOSIS — L814 Other melanin hyperpigmentation: Secondary | ICD-10-CM

## 2020-09-11 DIAGNOSIS — L918 Other hypertrophic disorders of the skin: Secondary | ICD-10-CM | POA: Diagnosis not present

## 2020-09-11 DIAGNOSIS — L578 Other skin changes due to chronic exposure to nonionizing radiation: Secondary | ICD-10-CM

## 2020-09-11 DIAGNOSIS — Z86018 Personal history of other benign neoplasm: Secondary | ICD-10-CM | POA: Diagnosis not present

## 2020-09-11 DIAGNOSIS — D225 Melanocytic nevi of trunk: Secondary | ICD-10-CM

## 2020-09-11 DIAGNOSIS — D2371 Other benign neoplasm of skin of right lower limb, including hip: Secondary | ICD-10-CM

## 2020-09-11 DIAGNOSIS — Z1283 Encounter for screening for malignant neoplasm of skin: Secondary | ICD-10-CM | POA: Diagnosis not present

## 2020-09-11 DIAGNOSIS — D1800 Hemangioma unspecified site: Secondary | ICD-10-CM

## 2020-09-11 DIAGNOSIS — D229 Melanocytic nevi, unspecified: Secondary | ICD-10-CM

## 2020-09-11 DIAGNOSIS — D2271 Melanocytic nevi of right lower limb, including hip: Secondary | ICD-10-CM

## 2020-09-11 NOTE — Progress Notes (Signed)
Follow-Up Visit   Subjective  Leslie Lowe is a 55 y.o. female who presents for the following: Annual Exam.  Patient here for TBSE. She has a history of dysplastic nevus of the mid back.  She complains of a spot on her left clavicle that gets rubbed, and tags on her neck and underarms that are irritated.  The following portions of the chart were reviewed this encounter and updated as appropriate:       Review of Systems:  No other skin or systemic complaints except as noted in HPI or Assessment and Plan.  Objective  Well appearing patient in no apparent distress; mood and affect are within normal limits.  A full examination was performed including scalp, head, eyes, ears, nose, lips, neck, chest, axillae, abdomen, back, buttocks, bilateral upper extremities, bilateral lower extremities, hands, feet, fingers, toes, fingernails, and toenails. All findings within normal limits unless otherwise noted below.  Objective  L clavicle: 5.28mm pink keratotic papule  *Photo taken after biopsy.     Objective  R spinal upper back: 2.74mm brown macules x 2   Right lateral knee: 3.21mm brown macule with dark brown small macule superior  Right Medial Heel: 2.33mm medium dark brown macule  Right Medial Plantar Foot: 3.79mm med brown macule  Objective  Right Nasal Ala x 2: 4.51mm and 5.70mm flesh papules   Assessment & Plan   Skin cancer screening performed today.  Actinic Damage - chronic, secondary to cumulative UV radiation exposure/sun exposure over time - diffuse scaly erythematous macules with underlying dyspigmentation - Recommend daily broad spectrum sunscreen SPF 30+ to sun-exposed areas, reapply every 2 hours as needed.  - Call for new or changing lesions.  Lentigines - Scattered tan macules - Discussed due to sun exposure - Benign, observe - Recommend daily broad spectrum sunscreen SPF 30+ to sun-exposed areas, reapply every 2 hours as needed. - Call for any  changes  Seborrheic Keratoses - Stuck-on, waxy, tan-brown papules and plaques  - Discussed benign etiology and prognosis. - Observe - Call for any changes  Melanocytic Nevi - Tan-brown and/or pink-flesh-colored symmetric macules and papules - Benign appearing on exam today - Observation - Call clinic for new or changing moles - Recommend daily use of broad spectrum spf 30+ sunscreen to sun-exposed areas.   Neoplasm of uncertain behavior of skin L clavicle  Skin / nail biopsy Type of biopsy: tangential   Informed consent: discussed and consent obtained   Patient was prepped and draped in usual sterile fashion: Area prepped with alcohol. Anesthesia: the lesion was anesthetized in a standard fashion   Anesthetic:  1% lidocaine w/ epinephrine 1-100,000 buffered w/ 8.4% NaHCO3 Instrument used: flexible razor blade   Hemostasis achieved with: pressure, aluminum chloride and electrodesiccation   Outcome: patient tolerated procedure well   Post-procedure details: wound care instructions given   Post-procedure details comment:  Ointment and small bandage applied  Specimen 1 - Surgical pathology Differential Diagnosis: Wart vs ISK r/o SCC Check Margins: No 5.36mm pink keratotic papule  Nevus (4) Right lateral knee; R spinal upper back; Right Medial Heel; Right Medial Plantar Foot  Benign-appearing.  Observation.  Call clinic for new or changing moles.  Recommend daily use of broad spectrum spf 30+ sunscreen to sun-exposed areas.    Irritated nevus Right Nasal Ala x 2  Benign-appearing.  Observation.    Patient would like moles removed. May remove on follow-up.  Discussed resulting small scar with shave removal, and possible recurrence of lesion.  Recommend vaseline ointment to area daily and cover until healed.  Recommend photoprotection/sunscreen to area to prevent discoloration of scar.  Once healed, may apply OTC Serica scar gel bid to thickened scars.    Hemangiomas - Red  papules - Discussed benign nature - Observe - Call for any changes  Dermatofibroma - Firm pink/brown papulenodule with dimple sign of the right pretibia, medial thighs - Benign appearing - Call for any changes   History of Dysplastic Nevus - No evidence of recurrence today of the mid back - Recommend regular full body skin exams - Recommend daily broad spectrum sunscreen SPF 30+ to sun-exposed areas, reapply every 2 hours as needed.  - Call if any new or changing lesions are noted between office visits  Acrochordons (Skin Tags) - Removal desired by patient - Fleshy, skin-colored pedunculated papules - Benign appearing.  - Patient desires removal. Reviewed that this is not covered by insurance and they will be charged a cosmetic fee for removal. Patient signed non-covered consent.  - Prior to the procedure, reviewed the expected small wound. Also reviewed the risk of leaving a small scar and the small risk of infection.  PROCEDURE - The areas were prepped with isopropyl alcohol. A small amount of lidocaine 1% with epinephrine was injected at the base of each lesion to achieve good local anesthesia. The skin tags were removed using a snip technique. Aluminum chloride was used for hemostasis. Petrolatum and a bandage were applied. The procedure was tolerated well. - Wound care was reviewed with the patient. They were advised to call with any concerns. Total number of treated acrochordons 4. (L axilla, R axilla, L neck, R neck)   Return in about 1 year (around 09/11/2021) for TBSE.   IJamesetta Orleans, CMA, am acting as scribe for Brendolyn Patty, MD .  Documentation: I have reviewed the above documentation for accuracy and completeness, and I agree with the above.  Brendolyn Patty MD

## 2020-09-11 NOTE — Patient Instructions (Addendum)
Wound Care Instructions  1. Cleanse wound gently with soap and water once a day then pat dry with clean gauze. Apply a thing coat of Petrolatum (petroleum jelly, "Vaseline") over the wound (unless you have an allergy to this). We recommend that you use a new, sterile tube of Vaseline. Do not pick or remove scabs. Do not remove the yellow or white "healing tissue" from the base of the wound.  2. Cover the wound with fresh, clean, nonstick gauze and secure with paper tape. You may use Band-Aids in place of gauze and tape if the would is small enough, but would recommend trimming much of the tape off as there is often too much. Sometimes Band-Aids can irritate the skin.  3. You should call the office for your biopsy report after 1 week if you have not already been contacted.  4. If you experience any problems, such as abnormal amounts of bleeding, swelling, significant bruising, significant pain, or evidence of infection, please call the office immediately.  5. FOR ADULT SURGERY PATIENTS: If you need something for pain relief you may take 1 extra strength Tylenol (acetaminophen) AND 2 Ibuprofen (200mg each) together every 4 hours as needed for pain. (do not take these if you are allergic to them or if you have a reason you should not take them.) Typically, you may only need pain medication for 1 to 3 days.   Melanoma ABCDEs  Melanoma is the most dangerous type of skin cancer, and is the leading cause of death from skin disease.  You are more likely to develop melanoma if you:  Have light-colored skin, light-colored eyes, or red or blond hair  Spend a lot of time in the sun  Tan regularly, either outdoors or in a tanning bed  Have had blistering sunburns, especially during childhood  Have a close family member who has had a melanoma  Have atypical moles or large birthmarks  Early detection of melanoma is key since treatment is typically straightforward and cure rates are extremely high if we  catch it early.   The first sign of melanoma is often a change in a mole or a new dark spot.  The ABCDE system is a way of remembering the signs of melanoma.  A for asymmetry:  The two halves do not match. B for border:  The edges of the growth are irregular. C for color:  A mixture of colors are present instead of an even brown color. D for diameter:  Melanomas are usually (but not always) greater than 6mm - the size of a pencil eraser. E for evolution:  The spot keeps changing in size, shape, and color.  Please check your skin once per month between visits. You can use a small mirror in front and a large mirror behind you to keep an eye on the back side or your body.   If you see any new or changing lesions before your next follow-up, please call to schedule a visit.  Please continue daily skin protection including broad spectrum sunscreen SPF 30+ to sun-exposed areas, reapplying every 2 hours as needed when you're outdoors.     

## 2020-09-17 ENCOUNTER — Telehealth: Payer: Self-pay

## 2020-09-17 NOTE — Telephone Encounter (Signed)
Lft pt msg to call for bx result./sh 

## 2020-09-17 NOTE — Telephone Encounter (Signed)
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

## 2020-09-17 NOTE — Telephone Encounter (Signed)
Advised pt of bx results/sh ?

## 2020-09-18 ENCOUNTER — Ambulatory Visit: Payer: BC Managed Care – PPO | Admitting: Psychiatry

## 2020-10-05 ENCOUNTER — Ambulatory Visit
Admission: RE | Admit: 2020-10-05 | Discharge: 2020-10-05 | Disposition: A | Discharge status: Home / Self Care | Payer: BC Managed Care – PPO | Source: Ambulatory Visit | Attending: Obstetrics and Gynecology | Admitting: Obstetrics and Gynecology

## 2020-10-05 ENCOUNTER — Other Ambulatory Visit: Payer: Self-pay

## 2020-10-05 DIAGNOSIS — Z1231 Encounter for screening mammogram for malignant neoplasm of breast: Secondary | ICD-10-CM | POA: Diagnosis not present

## 2020-10-08 ENCOUNTER — Ambulatory Visit: Payer: BC Managed Care – PPO | Admitting: Dermatology

## 2020-10-08 ENCOUNTER — Other Ambulatory Visit: Payer: Self-pay

## 2020-10-08 DIAGNOSIS — D229 Melanocytic nevi, unspecified: Secondary | ICD-10-CM

## 2020-10-08 DIAGNOSIS — D2239 Melanocytic nevi of other parts of face: Secondary | ICD-10-CM

## 2020-10-08 DIAGNOSIS — D485 Neoplasm of uncertain behavior of skin: Secondary | ICD-10-CM

## 2020-10-08 NOTE — Patient Instructions (Addendum)
Wound Care Instructions  1. Cleanse wound gently with soap and water once a day then pat dry with clean gauze. Apply a thing coat of Petrolatum (petroleum jelly, "Vaseline") over the wound (unless you have an allergy to this). We recommend that you use a new, sterile tube of Vaseline. Do not pick or remove scabs. Do not remove the yellow or white "healing tissue" from the base of the wound.  2. Cover the wound with fresh, clean, nonstick gauze and secure with paper tape. You may use Band-Aids in place of gauze and tape if the would is small enough, but would recommend trimming much of the tape off as there is often too much. Sometimes Band-Aids can irritate the skin.  3. You should call the office for your biopsy report after 1 week if you have not already been contacted.  4. If you experience any problems, such as abnormal amounts of bleeding, swelling, significant bruising, significant pain, or evidence of infection, please call the office immediately.  5. FOR ADULT SURGERY PATIENTS: If you need something for pain relief you may take 1 extra strength Tylenol (acetaminophen) AND 2 Ibuprofen (200mg  each) together every 4 hours as needed for pain. (do not take these if you are allergic to them or if you have a reason you should not take them.) Typically, you may only need pain medication for 1 to 3 days.    Recommend Serica moisturizing scar formula cream every night or [facility] brand or Mederma silicone scar sheet every night for the first year after a scar appears to help with scar remodeling if desired. Scars remodel on their own for a full year.

## 2020-10-08 NOTE — Progress Notes (Signed)
   Follow-Up Visit   Subjective  Leslie Lowe is a 55 y.o. female who presents for the following: Procedure (Patient here today to have 2 flesh papules at right nasal ala biopsied. ).    The following portions of the chart were reviewed this encounter and updated as appropriate:       Review of Systems:  No other skin or systemic complaints except as noted in HPI or Assessment and Plan.  Objective  Well appearing patient in no apparent distress; mood and affect are within normal limits.  A focused examination was performed including face. Relevant physical exam findings are noted in the Assessment and Plan.  Objective  right nasal alar crease superior: 4.10mm flesh papule     Objective  right nasal alar crease inferior: 5.55mm flesh papule   Assessment & Plan  Neoplasm of uncertain behavior of skin (2) right nasal alar crease superior  Epidermal / dermal shaving  Lesion diameter (cm):  0.4 Informed consent: discussed and consent obtained   Patient was prepped and draped in usual sterile fashion: Area prepped with alcohol. Anesthesia: the lesion was anesthetized in a standard fashion   Anesthetic:  1% lidocaine w/ epinephrine 1-100,000 buffered w/ 8.4% NaHCO3 Instrument used: DermaBlade   Hemostasis achieved with: pressure, aluminum chloride and electrodesiccation   Outcome: patient tolerated procedure well   Post-procedure details: wound care instructions given   Post-procedure details comment:  Ointment and small bandage applied.   Specimen 1 - Surgical pathology Differential Diagnosis: Irritated nevus vs other  Check Margins: No 4.60mm flesh papule  right nasal alar crease inferior  Epidermal / dermal shaving  Lesion diameter (cm):  0.5 Informed consent: discussed and consent obtained   Patient was prepped and draped in usual sterile fashion: Area prepped with alcohol. Anesthesia: the lesion was anesthetized in a standard fashion   Anesthetic:  1% lidocaine  w/ epinephrine 1-100,000 buffered w/ 8.4% NaHCO3 Instrument used: DermaBlade   Hemostasis achieved with: pressure, aluminum chloride and electrodesiccation   Outcome: patient tolerated procedure well   Post-procedure details: wound care instructions given   Post-procedure details comment:  Ointment and small bandage applied.   Specimen 2 - Surgical pathology Differential Diagnosis: Irritated Nevus vs Other  Check Margins: No 5.32mm flesh papule  Discussed resulting small scar with shave removal, and possible recurrence of lesion.  Recommend vaseline ointment to area daily and cover until healed.  Recommend photoprotection/sunscreen to area to prevent discoloration of scar.  Once healed, may apply OTC Serica scar gel bid to thickened scars.   Melanocytic Nevi - Tan-brown and/or pink-flesh-colored symmetric macules and papules - Benign appearing on exam today - Observation - Call clinic for new or changing moles - Recommend daily use of broad spectrum spf 30+ sunscreen to sun-exposed areas.   Return for TBSE as scheduled.  Graciella Belton, RMA, am acting as scribe for Brendolyn Patty, MD .  Documentation: I have reviewed the above documentation for accuracy and completeness, and I agree with the above.  Brendolyn Patty MD

## 2020-10-15 ENCOUNTER — Telehealth: Payer: Self-pay

## 2020-10-15 NOTE — Telephone Encounter (Signed)
Discussed biopsy results with pt  °

## 2020-10-15 NOTE — Telephone Encounter (Signed)
-----   Message from Brendolyn Patty, MD sent at 10/15/2020 11:45 AM EDT ----- 1. Skin , right nasal alar crease superior MELANOCYTIC NEVUS, INTRADERMAL TYPE, PERIPHERAL MARGIN INVOLVED 2. Skin , right nasal alar crease inferior MELANOCYTIC NEVUS, COMPOUND TYPE, BASE INVOLVED  Both benign moles, may recur

## 2020-10-17 ENCOUNTER — Ambulatory Visit (INDEPENDENT_AMBULATORY_CARE_PROVIDER_SITE_OTHER): Payer: BC Managed Care – PPO | Admitting: Psychiatry

## 2020-10-17 DIAGNOSIS — F411 Generalized anxiety disorder: Secondary | ICD-10-CM

## 2020-10-17 DIAGNOSIS — F329 Major depressive disorder, single episode, unspecified: Secondary | ICD-10-CM | POA: Diagnosis not present

## 2020-10-17 NOTE — Progress Notes (Signed)
Psychotherapy Progress Note [facility] [name], PhD LP  Patient ID: Leslie Lowe     MRN: [id number] Therapy format: Individual psychotherapy Date: 10/17/2020      Start: 5:04p     Stop: 5:54p     Time Spent: 50 min Location: Telehealth visit -- I connected with this patient by an approved telecommunication method (video), with her informed consent, and verifying identity and patient privacy.  I was located at my office and patient at her home.  As needed, we discussed the limitations, risks, and security and privacy concerns associated with telehealth service, including the availability and conditions which currently govern in-person appointments and the possibility that 3rd-party payment may not be fully guaranteed and she may be responsible for charges.  After she indicated understanding, we proceeded with the session.  Also discussed treatment planning, as needed, including ongoing verbal agreement with the plan, the opportunity to ask and answer all questions, her demonstrated understanding of instructions, and her readiness to call the office should symptoms worsen or she feels she is in a crisis state and needs more immediate and tangible assistance.   Session narrative (presenting needs, interim history, self-report of stressors and symptoms, applications of prior therapy, status changes, and interventions made in session) Switched to telehealth in consideration of storm forecast.    Anxiety down.  Med increase (PCP) helpful.  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  Ongoing pressure still of feeling evaluated, typically wants to see the school day end and get home ASAP.  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    Parents' situations -- Mother fell and broke her hand, cogent but less mobile, needs some assistance with meals, bathing.  MIL came to need oxygen round the clock, in her home.  FIL at home, with dementia.  Does feel M would be willing to have personal care come in.  In contrast, Bret's parents are not so amicable and reasonable, but they have bought LTCI that would cover.  FIL has not driven in two years (s/p CVA), but now he is on about driving again, being passive aggressive and grouchy with Bret.     Advocated talk with mother about hiring out some caregiver time so she can create some R&R time for herself and husband.  Same with inlaws (Bret's burden, obviously).  Referred to Alzheimer's Association and similar organizations for peers support and guidance interacting with parents with dementia.  Provided ideas for [name]'s interactions with his parents, available to consult together if desired.  Therapeutic modalities: Cognitive Behavioral Therapy, Solution-Oriented/Positive Psychology, Ego-Supportive and social problem-solving  Mental Status/Observations:  Appearance:   Casual     Behavior:  Appropriate  Motor:  Normal  Speech/Language:   Clear and Coherent  Affect:  Appropriate  Mood:  anxious and weary  Thought process:  normal  Thought content:    WNL  Sensory/Perceptual disturbances:    WNL  Orientation:  Fully oriented  Attention:  Good    Concentration:  Good  Memory:  WNL  Insight:    Good  Judgment:   Good  Impulse Control:  Good  Risk Assessment: Danger to Self: No Self-injurious Behavior: No Danger to Others: No Physical Aggression / Violence: No Duty to Warn: No Access to Firearms a concern: No  Assessment of progress:  situational setback(s)  Diagnosis:   ICD-10-CM   1. Generalized anxiety disorder  F41.1   2. Major depressive  episode  F32.9    Plan:  . Discussed with mother possible respite care in home . Shared guidance with husband about interacting with dementia, paranoia, and available help from groups like Alzheimer's Association . Other recommendations/advice as may be noted above . Continue to utilize previously learned skills ad lib . Maintain medication as prescribed and work faithfully with relevant prescriber(s) if any changes are desired or seem indicated . Call the clinic on-call service, present to ER, or call 911 if any life-threatening psychiatric crisis Return in about 2 months (around 12/17/2020) for will call. . Already scheduled visit in this office Visit date not found.  Blanchie Serve, PhD [name], PhD LP Clinical Psychologist, [facility] [address] [place], [place] [address] [phone]

## 2020-11-13 ENCOUNTER — Ambulatory Visit: Payer: BC Managed Care – PPO | Admitting: Dermatology

## 2021-08-05 ENCOUNTER — Ambulatory Visit: Payer: BC Managed Care – PPO | Admitting: Psychiatry

## 2021-08-05 ENCOUNTER — Other Ambulatory Visit: Payer: Self-pay

## 2021-08-05 DIAGNOSIS — Z636 Dependent relative needing care at home: Secondary | ICD-10-CM

## 2021-08-05 DIAGNOSIS — F411 Generalized anxiety disorder: Secondary | ICD-10-CM

## 2021-08-05 DIAGNOSIS — R69 Illness, unspecified: Secondary | ICD-10-CM

## 2021-08-05 NOTE — Progress Notes (Unsigned)
Psychotherapy Progress Note [facility] [name], PhD LP  Patient ID: NIKIE CID [facility])    MRN: [id number] Therapy format: {Therapy Types:21967::"Individual psychotherapy"} Date: 08/05/2021      Start: ***:***     Stop: ***:***     Time Spent: *** min Location: {SvcLoc:22530::"In-person"}   Session narrative (presenting needs, interim history, self-report of stressors and symptoms, applications of prior therapy, status changes, and interventions made in session) Back for feeling relapse in low mood, negative self-talk, sense of failing obligations to everyone, inadequate to demands, including mentally.  Suspects menopausal changes -- weight sticky, attention/memory softer, beginning to worry about dementia.  At home, two adult children at home now, one who seems depressed, "stalled" postgrad and underemployed, stays in bed a good amount, questioning her orientation, and avoids peers and grandparents.  MIL passed in May, on Mother's Day, after hospitalization and Hospice.  FIL still in his home, with combination of paid caregivers on w/e and family during the week.  Mother still in her own home, with some visitation.  Allowed FIL back his license after a DMV assessment and agreed upon restrictions.    Sleep sometimes spoiled.  Typically gets 6 good hours during the work week, c. 11-5/5:30.  H says she doesn't take long to fall asleep, but she admits typically working right up close to sleep time.    Work expectations can be pressuring, including messages about schools being responsible for preventing all failures to pass.  Had one tearful moment objecting, now fears   Addressed sleep and weight.  Would like to shed some weight, likes walking but doesn't make the time.  Oriented to exercise "snacks",  For morning worry thoughts, can put on music.    Therapeutic modalities: {AM:23362::"Cognitive Behavioral Therapy","Solution-Oriented/Positive Psychology"}  Mental  Status/Observations:  Appearance:   {PSY:22683}     Behavior:  {PSY:21022743}  Motor:  {PSY:22302}  Speech/Language:   {PSY:22685}  Affect:  {PSY:22687}  Mood:  {PSY:31886}  Thought process:  {PSY:31888}  Thought content:    {PSY:[phone]}  Sensory/Perceptual disturbances:    {PSY:352-537-6443}  Orientation:  {Psych Orientation:23301::"Fully oriented"}  Attention:  {Good-Fair-Poor ratings:23770::"Good"}    Concentration:  {Good-Fair-Poor ratings:23770::"Good"}  Memory:  {PSY:631-702-2370}  Insight:    {Good-Fair-Poor ratings:23770::"Good"}  Judgment:   {Good-Fair-Poor ratings:23770::"Good"}  Impulse Control:  {Good-Fair-Poor ratings:23770::"Good"}   Risk Assessment: Danger to Self: {Risk:22599::"No"} Self-injurious Behavior: {Risk:22599::"No"} Danger to Others: {Risk:22599::"No"} Physical Aggression / Violence: {Risk:22599::"No"} Duty to Warn: {AMYesNo:22526::"No"} Access to Firearms a concern: {AMYesNo:22526::"No"}  Assessment of progress:  {Progress:22147::"progressing"}  Diagnosis:   ICD-10-CM   1. Generalized anxiety disorder  F41.1     2. Caregiver stress  Z63.6     3. Suspected menopausal changes  R69      Plan:  Advise being decisive about evening time -- dedicated work time, dedicated nonwork, family time or personal time Set night light function on computer to limit blue light Explore exercise snacks For hard days, a "no thank you helping" of work Option to use music to help mindset Option to vulnerable question with principal how she is regarded since her momentOther recommendations/advice as may be noted above Continue to utilize previously learned skills ad lib Maintain medication as prescribed and work faithfully with relevant prescriber(s) if any changes are desired or seem indicated Call the clinic on-call service, 988/hotline, 911, or present to [facility] or ER if any life-threatening psychiatric crisis Return for time as available. Already scheduled visit in  this office Visit date not found.  Blanchie Serve, PhD [name], PhD LP Clinical Psychologist, [facility] [address], [place], [place] [address] [phone]

## 2021-08-29 ENCOUNTER — Other Ambulatory Visit: Payer: Self-pay | Admitting: Obstetrics and Gynecology

## 2021-08-29 DIAGNOSIS — Z1231 Encounter for screening mammogram for malignant neoplasm of breast: Secondary | ICD-10-CM

## 2021-09-24 ENCOUNTER — Encounter: Payer: BC Managed Care – PPO | Admitting: Dermatology

## 2021-10-16 ENCOUNTER — Ambulatory Visit
Admission: RE | Admit: 2021-10-16 | Discharge: 2021-10-16 | Disposition: A | Discharge status: Home / Self Care | Payer: BC Managed Care – PPO | Source: Ambulatory Visit | Attending: Obstetrics and Gynecology | Admitting: Obstetrics and Gynecology

## 2021-10-16 ENCOUNTER — Other Ambulatory Visit: Payer: Self-pay

## 2021-10-16 DIAGNOSIS — Z1231 Encounter for screening mammogram for malignant neoplasm of breast: Secondary | ICD-10-CM | POA: Insufficient documentation

## 2021-11-04 ENCOUNTER — Ambulatory Visit: Payer: BC Managed Care – PPO | Admitting: Psychiatry

## 2021-11-04 DIAGNOSIS — F320 Major depressive disorder, single episode, mild: Secondary | ICD-10-CM

## 2021-11-04 DIAGNOSIS — Z636 Dependent relative needing care at home: Secondary | ICD-10-CM | POA: Diagnosis not present

## 2021-11-04 DIAGNOSIS — F411 Generalized anxiety disorder: Secondary | ICD-10-CM

## 2021-11-04 NOTE — Progress Notes (Signed)
Psychotherapy Progress Note [facility] Leslie Moore, PhD LP  Patient ID: Leslie Lowe [facility])    MRN: [id number] Therapy format: Individual psychotherapy Date: 11/04/2021      Start: 2:14p     Stop: 3:01p     Time Spent: 47 min Location: In-person   Session narrative (presenting needs, interim history, self-report of stressors and symptoms, applications of prior therapy, status changes, and interventions made in session) Back after 3 mos.  Rough Jan/Feb, made medication change, saw PCP 3/22, trying Wellbutrin '150mg'$  XL now on top of venlafaxine, with possibility of fading venlafaxine.  Game-changer for energy and motivation, much appreciated.    Now thinking of changing job roles, but will need principal's approval.  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  Lady who's in it is retiring, [name] head wants [name].  Large pay cut, but it's what it may take to remain in the system til 73.  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  Addressed irrational guilt about income, feeling indebted to [name], who has already approved the plan despite it meaning $20K less.  Suggested going to [place] to get a clear, unambiguous statement it's OK.  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  The role might all call back skills that were useful in previous career, too.   Caregiving for [name]'s father and her own mom, both living alone, but mother fell last night.  Proceeds apace.    Therapeutic modalities: Cognitive Behavioral Therapy, Solution-Oriented/Positive Psychology, and  Ego-Supportive  Mental Status/Observations:  Appearance:   Casual     Behavior:  Appropriate  Motor:  Normal  Speech/Language:   Clear and Coherent  Affect:  Appropriate  Mood:  normal and with some anxious/guilty concern  Thought process:  normal  Thought content:    worry  Sensory/Perceptual disturbances:    WNL  Orientation:  Fully oriented  Attention:  Good    Concentration:  Good  Memory:  WNL  Insight:    Good  Judgment:   Good  Impulse Control:  Good   Risk Assessment: Danger to Self: No Self-injurious Behavior: No Danger to Others: No Physical Aggression / Violence: No Duty to Warn: No Access to Firearms a concern: No  Assessment of progress:  progressing  Diagnosis:   ICD-10-CM   1. Generalized anxiety disorder  F41.1     2. Caregiver stress  Z63.6     3. Current mild episode of major depressive disorder without prior episode ([place])  F32.0      Plan:  Ideas for communicating job change decision to principal OK to approach H about clarity he's OK with the move Recommend clarifying with mother and FIL what her role and limits can be Other recommendations/advice as may be noted above Continue to utilize previously learned skills ad lib Maintain medication as prescribed and work faithfully with relevant prescriber(s) if any changes are desired or seem indicated Call the clinic on-call service, 988/hotline, 911, or present to [facility] or ER if any life-threatening psychiatric crisis Return for time at discretion, will call. Already  scheduled visit in this office Visit date not found.  Blanchie Serve, PhD Leslie Moore, PhD LP Clinical Psychologist, [facility] [address], [place], [place] [address] [phone]

## 2021-11-11 ENCOUNTER — Ambulatory Visit (INDEPENDENT_AMBULATORY_CARE_PROVIDER_SITE_OTHER): Payer: BC Managed Care – PPO | Admitting: Psychiatry

## 2021-11-11 DIAGNOSIS — F320 Major depressive disorder, single episode, mild: Secondary | ICD-10-CM

## 2021-11-11 DIAGNOSIS — F411 Generalized anxiety disorder: Secondary | ICD-10-CM

## 2021-11-11 DIAGNOSIS — Z636 Dependent relative needing care at home: Secondary | ICD-10-CM | POA: Diagnosis not present

## 2021-11-11 NOTE — Progress Notes (Signed)
Psychotherapy Progress Note [facility] [name], PhD LP  Patient ID: ROAN SAWCHUK [facility])    MRN: [id number] Therapy format: Individual psychotherapy Date: 11/11/2021      Start: 3:20p     Stop: 3:52p     Time Spent: 32 min Location: Telehealth visit -- I connected with this patient by an approved telecommunication method (audio only), with her informed consent, and verifying identity and patient privacy.  I was located at my office and patient at her workplace.  As needed, we discussed the limitations, risks, and security and privacy concerns associated with telehealth service, including the availability and conditions which currently govern in-person appointments and the possibility that 3rd-party payment may not be fully guaranteed and she may be responsible for charges.  After she indicated understanding, we proceeded with the session.  Also discussed treatment planning, as needed, including ongoing verbal agreement with the plan, the opportunity to ask and answer all questions, her demonstrated understanding of instructions, and her readiness to call the office should symptoms worsen or she feels she is in a crisis state and needs more immediate and tangible assistance.   Session narrative (presenting needs, interim history, self-report of stressors and symptoms, applications of prior therapy, status changes, and interventions made in session) Had a student ([name]) commit suicide night before Easter last week, found out a few hrs after leaving the office last week.  First day back on the job today, turning out less difficult than she expected.  Obit last Friday, school put out a statement over the weekend, so some shock reduced.  She's had some sleepless nights wondering what she missed, with a quiet, intelligent, personable student who give no indication of being depressed.  A few sleepless nights, but finding that his friends didn't see it, either.  Had just finished reading  Things Fall Apart, where a main character hangs himself at the end.  Only clue is that mother has said he had hx of depression and anxiety.  Knows he did it while parents were away, and he jumped off a building.  Hard to get the picture out of her head when it's quiet, imagining what he did and not being able to stop, presumably impact as well.  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  Reframed as her natural empathy, on full, and invited to try an imagery-based technique to contextualize (healing prayer).  Probed her perspective on afterlife, led to imagine a meeting with Jesus, and following her lead had her ask Jesus to heal [name], and hear Jesus say "I've got him."  Reports helpful.  Framed this as something she can return to on her own as needed, just making sure to let herself get settled, imagine vividly, and ask or express anything at all, as moved, and watch/listen for response.  Available to return to it at interest.  Meanwhile, consulted about how to deal with [name]'s peers who may be upset, including boys, his friends, who might internalize rather than use counseling and discussion made available.  Already clear she can refer to designated counselors, does not have to go beyond her skills, and has Product manager and other admin to direct to.  Advised sheer empathy -- acknowledge it hurts, or scares, or is angry, and make sure he students hear that all those feelings are normal -- without saying "but" anything.  Be the one  to boldly say it hurts tremendously, and it does make [place] afraid to think about.  Therapeutic modalities: Cognitive Behavioral Therapy, Solution-Oriented/Positive Psychology, and Ego-Supportive  Mental Status/Observations:  Appearance:   Not assessed     Behavior:  Appropriate  Motor:  Not assessed  Speech/Language:   Clear and Coherent  Affect:  Not assessed   Mood:  anxious, sad, and consistent with subject  Thought process:  normal  Thought content:    WNL  Sensory/Perceptual disturbances:    WNL  Orientation:  Fully oriented  Attention:  Good    Concentration:  Good  Memory:  WNL  Insight:    Good  Judgment:   Good  Impulse Control:  Good   Risk Assessment: Danger to Self: No Self-injurious Behavior: No Danger to Others: No Physical Aggression / Violence: No Duty to Warn: No Access to Firearms a concern: No  Assessment of progress:  situational setback(s)  Diagnosis:   ICD-10-CM   1. Generalized anxiety disorder  F41.1    with acute situational exacerbation    2. Current mild episode of major depressive disorder without prior episode ([place])  F32.0     3. Caregiver stress  Z63.6      Plan:  As previous for ongoing circumstances Emphasize validation and empathy with bereaved/shocked students Other recommendations/advice as may be noted above Continue to utilize previously learned skills ad lib Maintain medication as prescribed and work faithfully with relevant prescriber(s) if any changes are desired or seem indicated Call the clinic on-call service, 988/hotline, 911, or present to [facility] or ER if any life-threatening psychiatric crisis Return for session(s) already scheduled. Already scheduled visit in this office Visit date not found.  Blanchie Serve, PhD [name], PhD LP Clinical Psychologist, [facility] [address], [place] [employer], [place] [address] [phone]

## 2022-01-07 ENCOUNTER — Ambulatory Visit: Payer: BC Managed Care – PPO | Admitting: Dermatology

## 2022-02-04 ENCOUNTER — Ambulatory Visit: Payer: BC Managed Care – PPO | Admitting: Dermatology

## 2022-02-04 DIAGNOSIS — L230 Allergic contact dermatitis due to metals: Secondary | ICD-10-CM | POA: Diagnosis not present

## 2022-02-04 DIAGNOSIS — Z86018 Personal history of other benign neoplasm: Secondary | ICD-10-CM

## 2022-02-04 DIAGNOSIS — D229 Melanocytic nevi, unspecified: Secondary | ICD-10-CM | POA: Diagnosis not present

## 2022-02-04 DIAGNOSIS — D1800 Hemangioma unspecified site: Secondary | ICD-10-CM

## 2022-02-04 DIAGNOSIS — D2371 Other benign neoplasm of skin of right lower limb, including hip: Secondary | ICD-10-CM

## 2022-02-04 DIAGNOSIS — D2372 Other benign neoplasm of skin of left lower limb, including hip: Secondary | ICD-10-CM

## 2022-02-04 DIAGNOSIS — L814 Other melanin hyperpigmentation: Secondary | ICD-10-CM

## 2022-02-04 DIAGNOSIS — Z1283 Encounter for screening for malignant neoplasm of skin: Secondary | ICD-10-CM

## 2022-02-04 DIAGNOSIS — L578 Other skin changes due to chronic exposure to nonionizing radiation: Secondary | ICD-10-CM

## 2022-02-04 DIAGNOSIS — D225 Melanocytic nevi of trunk: Secondary | ICD-10-CM

## 2022-02-04 DIAGNOSIS — L821 Other seborrheic keratosis: Secondary | ICD-10-CM

## 2022-02-04 DIAGNOSIS — L738 Other specified follicular disorders: Secondary | ICD-10-CM

## 2022-02-04 DIAGNOSIS — L811 Chloasma: Secondary | ICD-10-CM

## 2022-02-04 MED ORDER — DESONIDE 0.05 % EX CREA: TOPICAL_CREAM | CUTANEOUS | 2 refills | Status: DC

## 2022-02-04 NOTE — Patient Instructions (Addendum)
Recommend OTC Inkey Tranexamic Acid cream to apply to hyperpigmented areas on face at night.  Can buy at [facility], or online for around $15.  Recommend taking Heliocare sun protection supplement daily in sunny weather for additional sun protection. For maximum protection on the sunniest days, you can take up to 2 capsules of regular Heliocare OR take 1 capsule of Heliocare Ultra. For prolonged exposure (such as a full day in the sun), you can repeat your dose of the supplement 4 hours after your first dose. Heliocare can be purchased at [employer], at some [facility] or at [email or web address].   Melanoma ABCDEs  Melanoma is the most dangerous type of skin cancer, and is the leading cause of death from skin disease.  You are more likely to develop melanoma if you: Have light-colored skin, light-colored eyes, or red or blond hair Spend a lot of time in the sun Tan regularly, either outdoors or in a tanning bed Have had blistering sunburns, especially during childhood Have a close family member who has had a melanoma Have atypical moles or large birthmarks  Early detection of melanoma is key since treatment is typically straightforward and cure rates are extremely high if we catch it early.   The first sign of melanoma is often a change in a mole or a new dark spot.  The ABCDE system is a way of remembering the signs of melanoma.  A for asymmetry:  The two halves do not match. B for border:  The edges of the growth are irregular. C for color:  A mixture of colors are present instead of an even brown color. D for diameter:  Melanomas are usually (but not always) greater than 68m - the size of a pencil eraser. E for evolution:  The spot keeps changing in size, shape, and color.  Please check your skin once per month between visits. You can use a small mirror in front and a large mirror behind you to keep an eye on the back side or your body.   If you see any new or changing lesions  before your next follow-up, please call to schedule a visit.  Please continue daily skin protection including broad spectrum sunscreen SPF 30+ to sun-exposed areas, reapplying every 2 hours as needed when you're outdoors.   Staying in the shade or wearing long sleeves, sun glasses (UVA+UVB protection) and wide brim hats (4-inch brim around the entire circumference of the hat) are also recommended for sun protection.    Seborrheic Keratosis  What causes seborrheic keratoses? Seborrheic keratoses are harmless, common skin growths that first appear during adult life.  As time goes by, more growths appear.  Some people may develop a large number of them.  Seborrheic keratoses appear on both covered and uncovered body parts.  They are not caused by sunlight.  The tendency to develop seborrheic keratoses can be inherited.  They vary in color from skin-colored to gray, brown, or even black.  They can be either smooth or have a rough, warty surface.   Seborrheic keratoses are superficial and look as if they were stuck on the skin.  Under the microscope this type of keratosis looks like layers upon layers of skin.  That is why at times the top layer may seem to fall off, but the rest of the growth remains and re-grows.    Treatment Seborrheic keratoses do not need to be treated, but can easily be removed in the office.  Seborrheic keratoses often cause  symptoms when they rub on clothing or jewelry.  Lesions can be in the way of shaving.  If they become inflamed, they can cause itching, soreness, or burning.  Removal of a seborrheic keratosis can be accomplished by freezing, burning, or surgery. If any spot bleeds, scabs, or grows rapidly, please return to have it checked, as these can be an indication of a skin cancer.   Due to recent changes in healthcare laws, you may see results of your pathology and/or laboratory studies on MyChart before the doctors have had a chance to review them. We understand that  in some cases there may be results that are confusing or concerning to you. Please understand that not all results are received at the same time and often the doctors may need to interpret multiple results in order to provide you with the best plan of care or course of treatment. Therefore, we ask that you please give [place] 2 business days to thoroughly review all your results before contacting the office for clarification. Should we see a critical lab result, you will be contacted sooner.   If You Need Anything After Your Visit  If you have any questions or concerns for your doctor, please call our main line at [phone] and press option 4 to reach your doctor's medical assistant. If no one answers, please leave a voicemail as directed and we will return your call as soon as possible. Messages left after 4 pm will be answered the following business day.   You may also send [place] a message via [place]. We typically respond to MyChart messages within 1-2 business days.  For prescription refills, please ask your pharmacy to contact our office. Our fax number is [phone].  If you have an urgent issue when the clinic is closed that cannot wait until the next business day, you can page your doctor at the number below.    Please note that while we do our best to be available for urgent issues outside of office hours, we are not available 24/7.   If you have an urgent issue and are unable to reach [place], you may choose to seek medical care at your doctor's office, retail clinic, urgent care center, or emergency room.  If you have a medical emergency, please immediately call 911 or go to the emergency department.  Pager Numbers  - Dr. Nehemiah Massed: [phone]  - Dr. Laurence Ferrari: [phone]  - Dr. Nicole Kindred: [phone]  In the event of inclement weather, please call our main line at [phone] for an update on the status of any delays or closures.  Dermatology Medication Tips: Please keep the boxes  that topical medications come in in order to help keep track of the instructions about where and how to use these. Pharmacies typically print the medication instructions only on the boxes and not directly on the medication tubes.   If your medication is too expensive, please contact our office at [phone] option 4 or send [place] a message through [place].   We are unable to tell what your co-pay for medications will be in advance as this is different depending on your insurance coverage. However, we may be able to find a substitute medication at lower cost or fill out paperwork to get insurance to cover a needed medication.   If a prior authorization is required to get your medication covered by your insurance company, please allow [place] 1-2 business days to complete this process.  Drug prices often vary depending on where the prescription  is filled and some pharmacies may offer cheaper prices.  The website www.goodrx.com contains coupons for medications through different pharmacies. The prices here do not account for what the cost may be with help from insurance (it may be cheaper with your insurance), but the website can give you the price if you did not use any insurance.  - You can print the associated coupon and take it with your prescription to the pharmacy.  - You may also stop by our office during regular business hours and pick up a GoodRx coupon card.  - If you need your prescription sent electronically to a different pharmacy, notify our office through [facility] or by phone at [phone] option 4.     Si Usted Necesita Algo Despus de Su Visita  Tambin puede enviarnos un mensaje a travs de Pharmacist, community. Por lo general respondemos a los mensajes de MyChart en el transcurso de 1 a 2 das hbiles.  Para renovar recetas, por favor pida a su farmacia que se ponga en contacto con nuestra oficina. Harland Dingwall de fax es [place] [phone].  Si tiene un asunto urgente cuando la  clnica est cerrada y que no puede esperar hasta el siguiente da hbil, puede llamar/localizar a su doctor(a) al nmero que aparece a continuacin.   Por favor, tenga en cuenta que aunque hacemos todo lo posible para estar disponibles para asuntos urgentes fuera del horario de [place], no estamos disponibles las 24 horas del da, los 7 das de la [place].   Si tiene un problema urgente y no puede comunicarse con nosotros, puede optar por buscar atencin mdica  en el consultorio de su doctor(a), en una clnica privada, en un centro de atencin urgente o en una sala de emergencias.  Si tiene Engineering geologist, por favor llame inmediatamente al 911 o vaya a la sala de emergencias.  Nmeros de bper  - Dr. Nehemiah Massed: [phone]  - Dra. Moye: [phone]  - Dra. Nicole Kindred: [phone]  En caso de inclemencias del [place], por favor llame a Johnsie Kindred principal al [phone] para una actualizacin sobre el [place] de cualquier retraso o cierre.  Consejos para la medicacin en dermatologa: Por favor, guarde las cajas en las que vienen los medicamentos de uso tpico para ayudarle a seguir las instrucciones sobre dnde y cmo usarlos. Las farmacias generalmente imprimen las instrucciones del medicamento slo en las cajas y no directamente en los tubos del [place].   Si su medicamento es muy caro, por favor, pngase en contacto con Zigmund Daniel llamando al [phone] y presione la opcin 4 o envenos un mensaje a travs de Pharmacist, community.   No podemos decirle cul ser su copago por los medicamentos por adelantado ya que esto es diferente dependiendo de la cobertura de su seguro. Sin embargo, es posible que podamos encontrar un medicamento sustituto a Electrical engineer un formulario para que el seguro cubra el medicamento que se considera necesario.   Si se requiere una autorizacin previa para que su compaa de seguros [place] su medicamento, por favor permtanos de 1 a 2 das hbiles  para completar este proceso.  Los precios de los medicamentos varan con frecuencia dependiendo del Environmental consultant de dnde se surte la receta y alguna farmacias pueden ofrecer precios ms baratos.  El sitio web www.goodrx.com tiene cupones para medicamentos de Airline pilot. Los precios aqu no tienen en cuenta lo que podra costar con la ayuda del seguro (puede ser ms barato con su seguro), pero el sitio web puede  darle el precio si no utiliz [employer].  - Puede imprimir el cupn correspondiente y llevarlo con su receta a la farmacia.  - Tambin puede pasar por nuestra oficina durante el horario de atencin regular y Charity fundraiser una tarjeta de cupones de GoodRx.  - Si necesita que su receta se enve electrnicamente a una farmacia diferente, informe a nuestra oficina a travs de MyChart de [place] o por telfono llamando al [phone] y presione la opcin 4.

## 2022-02-04 NOTE — Progress Notes (Signed)
Follow-Up Visit   Subjective  Leslie Lowe is a 56 y.o. female who presents for the following: Annual Exam.  The patient presents for Total-Body Skin Exam (TBSE) for skin cancer screening and mole check.  The patient has spots, moles and lesions to be evaluated, some may be new or changing. Patient has a history of a nickel allergy and has used Desonide lotion in the past.She would like a refill, but would like to have a cream instead. She has a history of dysplastic nevus.   The following portions of the chart were reviewed this encounter and updated as appropriate:       Review of Systems:  No other skin or systemic complaints except as noted in HPI or Assessment and Plan.  Objective  Well appearing patient in no apparent distress; mood and affect are within normal limits.  A full examination was performed including scalp, head, eyes, ears, nose, lips, neck, chest, axillae, abdomen, back, buttocks, bilateral upper extremities, bilateral lower extremities, hands, feet, fingers, toes, fingernails, and toenails. All findings within normal limits unless otherwise noted below.  Mid Back R spinal upper back: 2.23m brown macules x 2     Right lateral knee: 3.017mbrown macule with dark brown small macule superior - photo taken today   Right Medial Heel: 2.64m24medium dark brown macule   Right Medial Plantar Foot: 3.64mm35md brown macule   Right upper arm: 5.0 x 4.0 mm medium brown macule     trunk, extremities Clear today  anterior and bilateral neck Reticulated hyperpigmented patches.     Assessment & Plan  Skin cancer screening performed today.  Actinic Damage - chronic, secondary to cumulative UV radiation exposure/sun exposure over time - diffuse scaly erythematous macules with underlying dyspigmentation - Recommend daily broad spectrum sunscreen SPF 30+ to sun-exposed areas, reapply every 2 hours as needed.  - Recommend staying in the shade or wearing long  sleeves, sun glasses (UVA+UVB protection) and wide brim hats (4-inch brim around the entire circumference of the hat). - Call for new or changing lesions.  Lentigines - Scattered tan macules - Due to sun exposure - Benign-appering, observe - Recommend daily broad spectrum sunscreen SPF 30+ to sun-exposed areas, reapply every 2 hours as needed. - Call for any changes  Seborrheic Keratoses - Stuck-on, waxy, tan-brown papules and/or plaques, including left temple  - Benign-appearing - Discussed benign etiology and prognosis. - Observe - Call for any changes  Hemangiomas - Red papules - Discussed benign nature - Observe - Call for any changes  Dermatofibroma - Firm pink/brown papulenodule with dimple sign L med thigh, R med thigh, R pretibia - Benign appearing - Call for any changes  Sebaceous Hyperplasia - Small yellow papules with a central dell of the forehead - Benign - Observe  Melanocytic Nevi - Tan-brown and/or pink-flesh-colored symmetric macules and papules, including left temple/scalp - Benign appearing on exam today - Observation - Call clinic for new or changing moles - Recommend daily use of broad spectrum spf 30+ sunscreen to sun-exposed areas.   Nevus Mid Back  Benign-appearing.  Stable. Observation.  Call clinic for new or changing moles.  Recommend daily use of broad spectrum spf 30+ sunscreen to sun-exposed areas.   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  Continue desonide cream qd/bid AA prn flares dsp 15g 1Rf.   desonide (DESOWEN) 0.05 % cream - trunk, extremities Apply to affected areas rash twice daily  until improved.  Melasma anterior and bilateral neck  Chronic and persistent condition with duration or expected duration over one year. Condition is symptomatic/ bothersome to patient. Not currently at goal.   Melasma is a chronic; persistent condition of hyperpigmented patches  generally on the face, worse in summer due to higher UV exposure.    Heredity; thyroid disease; sun exposure; pregnancy; birth control pills; epilepsy medication and darker skin may predispose to Melasma.   Recommendations include: - Sun avoidance and daily broad spectrum (UVA/UVB) sunscreen SPF 30+, preferably with Zinc or Titanium Dioxide. - Rx topical bleaching creams (i.e. hydroquinone) is a common treatment but should not be used long term.  Hydroquinones may be mixed with retinoids; steroids; Kojic Acid. - Rx Azelaic Acid is also a treatment option that is safe for pregnancy (Category B). - OTC Heliocare can be helpful in control and prevention. - Oral Rx with Tranexamic Acid 250 mg - 650 mg po daily can be used for moderate to severe cases especially during summer (contraindications include pregnancy; lactation; hx of PE; DVT; clotting disorder; heart disease; anticoagulant use and upcoming long trips)   - Chemical peels (would need multiple for best result).  - Lasers and  Microdermabrasion may also be helpful adjunct treatments.  Pt declines hydroquinone, since had irritation from it when used in the past. Also discussed azelaic acid, pt declines at this time.  Will start zinc containing sunscreen -uses Elta MD. Recommend OTC Inkey Tranexamic Acid cream to apply to hyperpigmented areas on face at night.  Can buy at [facility], or online for around $15. Also recommend Heliocare prior to sun exposure.    Return in about 1 year (around 02/05/2023) for TBSE.  IJamesetta Orleans, CMA, am acting as scribe for Brendolyn Patty, MD .  Documentation: I have reviewed the above documentation for accuracy and completeness, and I agree with the above.  Brendolyn Patty MD

## 2022-02-10 ENCOUNTER — Ambulatory Visit (INDEPENDENT_AMBULATORY_CARE_PROVIDER_SITE_OTHER): Payer: BC Managed Care – PPO | Admitting: Psychiatry

## 2022-02-10 DIAGNOSIS — F325 Major depressive disorder, single episode, in full remission: Secondary | ICD-10-CM

## 2022-02-10 DIAGNOSIS — F411 Generalized anxiety disorder: Secondary | ICD-10-CM | POA: Diagnosis not present

## 2022-02-10 DIAGNOSIS — Z636 Dependent relative needing care at home: Secondary | ICD-10-CM | POA: Diagnosis not present

## 2022-02-10 NOTE — Progress Notes (Signed)
Psychotherapy Progress Note [facility] [name], PhD LP  Patient ID: Leslie Lowe [facility])    MRN: [id number] Therapy format: Individual psychotherapy Date: 02/10/2022      Start: 1:12p     Stop: 2:02p     Time Spent: 50 min Location: In-person   Session narrative (presenting needs, interim history, self-report of stressors and symptoms, applications of prior therapy, status changes, and interventions made in session) 3 months ago phone session in which Leslie Lowe acknowledged a recent student suicide, her first day back at school, working through the impact on his peer group, and being deeply affected by a fictional story that ends with suicide.  Addressed with healing prayer visualization of Jesus responding to say "I've got him", and empathizing with Leslie Lowe's peers before anything else.    Things worked out with Leslie Lowe, visualization very helpful.  Today, wants to prioritize job change and flying anxiety.  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  Second-guessing the change some, wondering if she "should have" stuck it out and worrying some about the learning curve in the new work.  On confrontation, realizes she would not questions someone else, and she would counsel a ruminating student (e.g, failed English last year) to commit the attention and effort to what can be done now.  Validated also that she made the decision for sound reasons, including overwork/burnout when added to caregiving stress.  Has a flight home from [place] in about 3 weeks, following driving daughter to [place].  Hx of air travel anxiety, has had panic attack on plane before, second time happened despite Xanax.  Has tried Bonine (like dramamine), which works for motion sickness but no avail.  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  Has been through about 4 flights, always anxious, starts the day with nervous stomach.  Main  provocations are bumps, turbulence, fear of crashing, and obsessing about feeling anxious and what if it gets worse.  Acknowledged fear of crashing, and being off line for responsibilities to her (now-grown) children, putting them through grief, etc.  Assets in play -- is on Effexor + Wellbutrin, Leslie Lowe will be there and can be contact comfort.  Validated anxiety, reframed the task from suppressing anxiety to coping well, recognized that being in control of a moving vehicle means a lot to her, and led to imagine being present for all the professional, skilled things the pilots know how to do for her and the other passengers.  Some reassurance.  Led to imagine putting her hands out the windows of a car and feel the lift of the air, then generalize to hands the size of airplane wings, at air speed, and what that would feel like, then translate that to the airplane itself and what powerful lift that is.  Led to imagine the actual softness of changes in the air, and some time in flight when it's so smooth you can't tell you're moving except for the air sound.  Also the feel of the personal air vents, the freedom to walk the aisle to the bathroom, and other mundane, comfortable, unconfined aspects of flying.  Mainly oriented to reframing flying from helpless/confined/out of touch to one-with-the-plane, freedom to feel the movement experience and encouraged to look for that as she goes through with the flight.   Therapeutic modalities: Cognitive Behavioral Therapy and Solution-Oriented/Positive Psychology  Mental Status/Observations:  Appearance:   Casual  Behavior:  Appropriate  Motor:  Normal  Speech/Language:   Clear and Coherent  Affect:  Appropriate  Mood:  anxious  Thought process:  normal  Thought content:    WNL  Sensory/Perceptual disturbances:    WNL  Orientation:  Fully oriented  Attention:  Good    Concentration:  Good  Memory:  WNL  Insight:    Good  Judgment:   Good  Impulse  Control:  Good   Risk Assessment: Danger to Self: No Self-injurious Behavior: No Danger to Others: No Physical Aggression / Violence: No Duty to Warn: No Access to Firearms a concern: No  Assessment of progress:  progressing well  Diagnosis:   ICD-10-CM   1. Generalized anxiety disorder  F41.1    with panic and performance anxiety components     2. 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, engineering imagery to [place], especially (1) paying grounding attention in the moment to what feels solid, or interesting, or normal, and (2) reimagining flight as her own hands, big as wings, feeling the full lift of the air Use breathing tactics and self-talk for anxiety moments ad lib Other recommendations/advice as may be noted above Continue to utilize previously learned skills ad lib Maintain medication as prescribed and work faithfully with relevant prescriber(s) if any changes are desired or seem indicated Call the clinic on-call service, 988/hotline, 911, or present to [facility] or ER if any life-threatening psychiatric crisis Return for time at discretion. Already scheduled visit in this office 03/10/2022.  Blanchie Serve, PhD [name], PhD LP Clinical Psychologist, [facility] [address] [place], [place] [address] [phone]

## 2022-03-10 ENCOUNTER — Ambulatory Visit: Payer: BC Managed Care – PPO | Admitting: Psychiatry

## 2022-09-03 ENCOUNTER — Other Ambulatory Visit: Payer: Self-pay | Admitting: Obstetrics and Gynecology

## 2022-09-03 DIAGNOSIS — Z1231 Encounter for screening mammogram for malignant neoplasm of breast: Secondary | ICD-10-CM

## 2022-10-28 ENCOUNTER — Ambulatory Visit
Admission: RE | Admit: 2022-10-28 | Discharge: 2022-10-28 | Disposition: A | Discharge status: Home / Self Care | Payer: BC Managed Care – PPO | Source: Ambulatory Visit | Attending: Obstetrics and Gynecology | Admitting: Obstetrics and Gynecology

## 2022-10-28 DIAGNOSIS — Z1231 Encounter for screening mammogram for malignant neoplasm of breast: Secondary | ICD-10-CM

## 2023-02-09 ENCOUNTER — Ambulatory Visit: Payer: BC Managed Care – PPO | Admitting: Dermatology

## 2023-02-09 ENCOUNTER — Encounter: Payer: Self-pay | Admitting: Dermatology

## 2023-02-09 VITALS — BP 124/82 | HR 87

## 2023-02-09 DIAGNOSIS — D2261 Melanocytic nevi of right upper limb, including shoulder: Secondary | ICD-10-CM

## 2023-02-09 DIAGNOSIS — L821 Other seborrheic keratosis: Secondary | ICD-10-CM

## 2023-02-09 DIAGNOSIS — D2372 Other benign neoplasm of skin of left lower limb, including hip: Secondary | ICD-10-CM

## 2023-02-09 DIAGNOSIS — L820 Inflamed seborrheic keratosis: Secondary | ICD-10-CM | POA: Diagnosis not present

## 2023-02-09 DIAGNOSIS — D229 Melanocytic nevi, unspecified: Secondary | ICD-10-CM

## 2023-02-09 DIAGNOSIS — D225 Melanocytic nevi of trunk: Secondary | ICD-10-CM

## 2023-02-09 DIAGNOSIS — Z1283 Encounter for screening for malignant neoplasm of skin: Secondary | ICD-10-CM

## 2023-02-09 DIAGNOSIS — L918 Other hypertrophic disorders of the skin: Secondary | ICD-10-CM

## 2023-02-09 DIAGNOSIS — L578 Other skin changes due to chronic exposure to nonionizing radiation: Secondary | ICD-10-CM | POA: Diagnosis not present

## 2023-02-09 DIAGNOSIS — Z872 Personal history of diseases of the skin and subcutaneous tissue: Secondary | ICD-10-CM

## 2023-02-09 DIAGNOSIS — Z86018 Personal history of other benign neoplasm: Secondary | ICD-10-CM

## 2023-02-09 DIAGNOSIS — L814 Other melanin hyperpigmentation: Secondary | ICD-10-CM

## 2023-02-09 DIAGNOSIS — W908XXA Exposure to other nonionizing radiation, initial encounter: Secondary | ICD-10-CM | POA: Diagnosis not present

## 2023-02-09 DIAGNOSIS — D239 Other benign neoplasm of skin, unspecified: Secondary | ICD-10-CM

## 2023-02-09 DIAGNOSIS — D2271 Melanocytic nevi of right lower limb, including hip: Secondary | ICD-10-CM

## 2023-02-09 DIAGNOSIS — D2371 Other benign neoplasm of skin of right lower limb, including hip: Secondary | ICD-10-CM

## 2023-02-09 DIAGNOSIS — D170 Benign lipomatous neoplasm of skin and subcutaneous tissue of head, face and neck: Secondary | ICD-10-CM

## 2023-02-09 DIAGNOSIS — L738 Other specified follicular disorders: Secondary | ICD-10-CM

## 2023-02-09 MED ORDER — PIMECROLIMUS 1 % EX CREA: TOPICAL_CREAM | Freq: Two times a day (BID) | CUTANEOUS | 4 refills | Status: DC

## 2023-02-09 MED ORDER — PIMECROLIMUS 1 % EX CREA
TOPICAL_CREAM | CUTANEOUS | 4 refills | Status: DC
Start: 2023-02-09 — End: 2023-06-08

## 2023-02-09 NOTE — Progress Notes (Signed)
Follow-Up Visit   Subjective  Leslie Lowe is a 57 y.o. female who presents for the following: Skin Cancer Screening and Full Body Skin Exam hx of dysplastic nevi, hx of aks,   The patient presents for Total-Body Skin Exam (TBSE) for skin cancer screening and mole check. The patient has spots, moles and lesions to be evaluated, some may be new or changing and the patient may have concern these could be cancer.    The following portions of the chart were reviewed this encounter and updated as appropriate: medications, allergies, medical history  Review of Systems:  No other skin or systemic complaints except as noted in HPI or Assessment and Plan.  Objective  Well appearing patient in no apparent distress; mood and affect are within normal limits.  A full examination was performed including scalp, head, eyes, ears, nose, lips, neck, chest, axillae, abdomen, back, buttocks, bilateral upper extremities, bilateral lower extremities, hands, feet, fingers, toes, fingernails, and toenails. All findings within normal limits unless otherwise noted below.   Relevant physical exam findings are noted in the Assessment and Plan.  intermammary fold Erythematous stuck-on, waxy papules    Assessment & Plan   SKIN CANCER SCREENING PERFORMED TODAY.  ACTINIC DAMAGE - Chronic condition, secondary to cumulative UV/sun exposure - diffuse scaly erythematous macules with underlying dyspigmentation - Recommend daily broad spectrum sunscreen SPF 30+ to sun-exposed areas, reapply every 2 hours as needed.  - Staying in the shade or wearing long sleeves, sun glasses (UVA+UVB protection) and wide brim hats (4-inch brim around the entire circumference of the hat) are also recommended for sun protection.  - Call for new or changing lesions.  LENTIGINES, SEBORRHEIC KERATOSES, HEMANGIOMAS - Benign normal skin lesions - Benign-appearing - Call for any changes Sks at rt temple   Sebaceous Hyperplasia -  Small yellow papules with a central dell of the forehead - Benign - Observe  Lipoma  Exam: 1.8 cm Subcutaneous rubbery nodule(s) Location:  left frontal hairline   Benign-appearing. Exam most consistent with a lipoma. Discussed that a lipoma is a benign fatty growth that can grow over time and sometimes get irritated. Recommend observation if it is not bothersome or changing. Discussed option of ILK injections or surgical excision to remove it if it is growing, symptomatic, or other changes noted. Please call for new or changing lesions so they can be evaluated.   Dermatofibroma - Firm pink/brown papulenodule with dimple sign L med thigh, R med thigh, R pretibia - benign growth possibly related to trauma, such as an insect bite, cut from shaving, or inflamed acne-type bump.   -Treatment options to remove include shave or excision with resulting scar and risk of recurrence.  Since not bothersome, will observe for now.    MELANOCYTIC NEVI - Tan-brown and/or pink-flesh-colored symmetric macules and papules - Benign appearing on exam today - Observation - Call clinic for new or changing moles - Recommend daily use of broad spectrum spf 30+ sunscreen to sun-exposed areas.   Nevus Mid Back   R spinal upper back: 2.72mm brown macules x 2     Right lateral knee: 3.74mm brown macule with dark brown small macule superior    Right Medial Heel: 2.67mm medium dark brown macule   Right Medial Plantar Foot: 3.83mm med brown macule  Right upper arm 5 mm med brown macule   Benign-appearing.  Stable. Observation.  Call clinic for new or changing moles.  Recommend daily use of broad spectrum spf 30+ sunscreen  to sun-exposed areas.    Acrochordons (Skin Tags) Inframammary - Fleshy, skin-colored pedunculated papules - Benign appearing.  - Observe. - If desired, they can be removed with an in office procedure that is not covered by insurance. - Please call the clinic if you notice any new  or changing lesions.   History of Dysplastic Nevus See history  Possible dysplastic nevus - linear scar at right sacral area will call GP to confirm - No evidence of recurrence today of the mid back - Recommend regular full body skin exams - Recommend daily broad spectrum sunscreen SPF 30+ to sun-exposed areas, reapply every 2 hours as needed.  - Call if any new or changing lesions are noted between office visits  Inflamed seborrheic keratosis intermammary fold  Symptomatic, irritating, patient would like treated.  Patient deferred cryotherapy at this time  Start pimecrolimus 1 % cream - apply qd/bid to affected areas of rash prn.   Recommend OTC Zeasorb AF powder to body folds daily after shower.  It is often found in the athlete's foot section in the pharmacy.  Avoid using powders that contain cornstarch.   pimecrolimus (ELIDEL) 1 % cream - intermammary fold Apply topically qd/bid to affected areas of rash prn   Return in about 1 year (around 02/09/2024) for TBSE.  I, Asher Muir, CMA, am acting as scribe for Willeen Niece, MD.   Documentation: I have reviewed the above documentation for accuracy and completeness, and I agree with the above.  Willeen Niece, MD

## 2023-02-09 NOTE — Patient Instructions (Addendum)
Dark circles under eyes  Could try Alastin Eye Cream   Blepharoplasty or eye filler   Discussed botox for fine lines and wrinkles around eye        For itchy areas under breast   Start Elidel 1 % cream - apply daily/twice daily to affected areas of rash as needed.   Recommend OTC Zeasorb AF powder to body folds daily after shower.  It is often found in the athlete's foot section in the pharmacy.  Avoid using powders that contain cornstarch.   Seborrheic Keratosis  What causes seborrheic keratoses? Seborrheic keratoses are harmless, common skin growths that first appear during adult life.  As time goes by, more growths appear.  Some people may develop a large number of them.  Seborrheic keratoses appear on both covered and uncovered body parts.  They are not caused by sunlight.  The tendency to develop seborrheic keratoses can be inherited.  They vary in color from skin-colored to gray, brown, or even black.  They can be either smooth or have a rough, warty surface.   Seborrheic keratoses are superficial and look as if they were stuck on the skin.  Under the microscope this type of keratosis looks like layers upon layers of skin.  That is why at times the top layer may seem to fall off, but the rest of the growth remains and re-grows.    Treatment Seborrheic keratoses do not need to be treated, but can easily be removed in the office.  Seborrheic keratoses often cause symptoms when they rub on clothing or jewelry.  Lesions can be in the way of shaving.  If they become inflamed, they can cause itching, soreness, or burning.  Removal of a seborrheic keratosis can be accomplished by freezing, burning, or surgery. If any spot bleeds, scabs, or grows rapidly, please return to have it checked, as these can be an indication of a skin cancer.         Melanoma ABCDEs  Melanoma is the most dangerous type of skin cancer, and is the leading cause of death from skin disease.  You are more  likely to develop melanoma if you: Have light-colored skin, light-colored eyes, or red or blond hair Spend a lot of time in the sun Tan regularly, either outdoors or in a tanning bed Have had blistering sunburns, especially during childhood Have a close family member who has had a melanoma Have atypical moles or large birthmarks  Early detection of melanoma is key since treatment is typically straightforward and cure rates are extremely high if we catch it early.   The first sign of melanoma is often a change in a mole or a new dark spot.  The ABCDE system is a way of remembering the signs of melanoma.  A for asymmetry:  The two halves do not match. B for border:  The edges of the growth are irregular. C for color:  A mixture of colors are present instead of an even brown color. D for diameter:  Melanomas are usually (but not always) greater than 6mm - the size of a pencil eraser. E for evolution:  The spot keeps changing in size, shape, and color.  Please check your skin once per month between visits. You can use a small mirror in front and a large mirror behind you to keep an eye on the back side or your body.   If you see any new or changing lesions before your next follow-up, please call to schedule a  visit.  Please continue daily skin protection including broad spectrum sunscreen SPF 30+ to sun-exposed areas, reapplying every 2 hours as needed when you're outdoors.   Staying in the shade or wearing long sleeves, sun glasses (UVA+UVB protection) and wide brim hats (4-inch brim around the entire circumference of the hat) are also recommended for sun protection.     Due to recent changes in healthcare laws, you may see results of your pathology and/or laboratory studies on MyChart before the doctors have had a chance to review them. We understand that in some cases there may be results that are confusing or concerning to you. Please understand that not all results are received at the  same time and often the doctors may need to interpret multiple results in order to provide you with the best plan of care or course of treatment. Therefore, we ask that you please give [place] 2 business days to thoroughly review all your results before contacting the office for clarification. Should we see a critical lab result, you will be contacted sooner.   If You Need Anything After Your Visit  If you have any questions or concerns for your doctor, please call our main line at [phone] and press option 4 to reach your doctor's medical assistant. If no one answers, please leave a voicemail as directed and we will return your call as soon as possible. Messages left after 4 pm will be answered the following business day.   You may also send [place] a message via MyChart. We typically respond to MyChart messages within 1-2 business days.  For prescription refills, please ask your pharmacy to contact our office. Our fax number is [phone].  If you have an urgent issue when the clinic is closed that cannot wait until the next business day, you can page your doctor at the number below.    Please note that while we do our best to be available for urgent issues outside of office hours, we are not available 24/7.   If you have an urgent issue and are unable to reach [place], you may choose to seek medical care at your doctor's office, retail clinic, urgent care center, or emergency room.  If you have a medical emergency, please immediately call 911 or go to the emergency department.  Pager Numbers  - Dr. Gwen Pounds: [phone]  - Dr. Neale Burly: [phone]  - Dr. Roseanne Reno: [phone]  In the event of inclement weather, please call our main line at [phone] for an update on the status of any delays or closures.  Dermatology Medication Tips: Please keep the boxes that topical medications come in in order to help keep track of the instructions about where and how to use these. Pharmacies typically  print the medication instructions only on the boxes and not directly on the medication tubes.   If your medication is too expensive, please contact our office at [phone] option 4 or send [place] a message through MyChart.   We are unable to tell what your co-pay for medications will be in advance as this is different depending on your insurance coverage. However, we may be able to find a substitute medication at lower cost or fill out paperwork to get insurance to cover a needed medication.   If a prior authorization is required to get your medication covered by your insurance company, please allow [place] 1-2 business days to complete this process.  Drug prices often vary depending on where the prescription is filled and some pharmacies may offer  cheaper prices.  The website www.goodrx.com contains coupons for medications through different pharmacies. The prices here do not account for what the cost may be with help from insurance (it may be cheaper with your insurance), but the website can give you the price if you did not use any insurance.  - You can print the associated coupon and take it with your prescription to the pharmacy.  - You may also stop by our office during regular business hours and pick up a GoodRx coupon card.  - If you need your prescription sent electronically to a different pharmacy, notify our office through [facility] or by phone at [phone] option 4.     Si Usted Necesita Algo Despus de Su Visita  Tambin puede enviarnos un mensaje a travs de Clinical cytogeneticist. Por lo general respondemos a los mensajes de MyChart en el transcurso de 1 a 2 das hbiles.  Para renovar recetas, por favor pida a su farmacia que se ponga en contacto con nuestra oficina. Annie Sable de fax es [place] [phone].  Si tiene un asunto urgente cuando la clnica est cerrada y que no puede esperar hasta el siguiente da hbil, puede llamar/localizar a su doctor(a) al nmero que aparece a  continuacin.   Por favor, tenga en cuenta que aunque hacemos todo lo posible para estar disponibles para asuntos urgentes fuera del horario de [place], no estamos disponibles las 24 horas del da, los 7 [address]  [address] de la [place].   Si tiene un problema urgente y no puede comunicarse con nosotros, puede optar por buscar atencin mdica  en el consultorio de su doctor(a), en una clnica privada, en un centro de atencin urgente o en una sala de emergencias.  Si tiene Engineer, drilling, por favor llame inmediatamente al 911 o vaya a la sala de emergencias.  Nmeros de bper  - Dr. Gwen Pounds: [phone]  - Dra. Moye: [phone]  - Dra. Roseanne Reno: [phone]  En caso de inclemencias del [place], por favor llame a Lacy Duverney principal al [phone] para una actualizacin sobre el [place] de cualquier retraso o cierre.  Consejos para la medicacin en dermatologa: Por favor, guarde las cajas en las que vienen los medicamentos de uso tpico para ayudarle a seguir las instrucciones sobre dnde y cmo usarlos. Las farmacias generalmente imprimen las instrucciones del medicamento slo en las cajas y no directamente en los tubos del [place].   Si su medicamento es muy caro, por favor, pngase en contacto con Rolm Gala llamando al [phone] y presione la opcin 4 o envenos un mensaje a travs de Clinical cytogeneticist.   No podemos decirle cul ser su copago por los medicamentos por adelantado ya que esto es diferente dependiendo de la cobertura de su seguro. Sin embargo, es posible que podamos encontrar un medicamento sustituto a Audiological scientist un formulario para que el seguro cubra el medicamento que se considera necesario.   Si se requiere una autorizacin previa para que su compaa de seguros [place] su medicamento, por favor permtanos de 1 a 2 das hbiles para completar [address]  Los precios de los medicamentos varan con frecuencia dependiendo del Environmental consultant de dnde se surte la receta  y alguna farmacias pueden ofrecer precios ms baratos.  El sitio web www.goodrx.com tiene cupones para medicamentos de Health and safety inspector. Los precios aqu no tienen en cuenta lo que podra costar con la ayuda del seguro (puede ser ms barato con su seguro), pero el sitio web puede darle el precio si no utiliz ningn  seguro.  - Puede imprimir el cupn correspondiente y llevarlo con su receta a la farmacia.  - Tambin puede pasar por nuestra oficina durante el horario de atencin regular y Education officer, museum una tarjeta de cupones de GoodRx.  - Si necesita que su receta se enve electrnicamente a una farmacia diferente, informe a nuestra oficina a travs de MyChart de [place] o por telfono llamando al [phone] y presione la opcin 4.

## 2023-02-17 ENCOUNTER — Other Ambulatory Visit: Payer: Self-pay | Admitting: Student

## 2023-02-17 DIAGNOSIS — M7581 Other shoulder lesions, right shoulder: Secondary | ICD-10-CM

## 2023-02-17 DIAGNOSIS — M7521 Bicipital tendinitis, right shoulder: Secondary | ICD-10-CM

## 2023-02-17 DIAGNOSIS — M7541 Impingement syndrome of right shoulder: Secondary | ICD-10-CM

## 2023-02-18 ENCOUNTER — Encounter: Payer: Self-pay | Admitting: Student

## 2023-02-28 ENCOUNTER — Other Ambulatory Visit: Payer: BC Managed Care – PPO

## 2023-03-02 ENCOUNTER — Ambulatory Visit
Admission: RE | Admit: 2023-03-02 | Discharge: 2023-03-02 | Disposition: A | Discharge status: Home / Self Care | Payer: BC Managed Care – PPO | Source: Ambulatory Visit | Attending: Student | Admitting: Student

## 2023-03-02 DIAGNOSIS — M7521 Bicipital tendinitis, right shoulder: Secondary | ICD-10-CM

## 2023-03-02 DIAGNOSIS — M7541 Impingement syndrome of right shoulder: Secondary | ICD-10-CM

## 2023-03-02 DIAGNOSIS — M7581 Other shoulder lesions, right shoulder: Secondary | ICD-10-CM

## 2023-05-26 ENCOUNTER — Ambulatory Visit: Payer: BC Managed Care – PPO | Admitting: Dermatology

## 2023-05-26 DIAGNOSIS — Z79899 Other long term (current) drug therapy: Secondary | ICD-10-CM

## 2023-05-26 DIAGNOSIS — L304 Erythema intertrigo: Secondary | ICD-10-CM | POA: Diagnosis not present

## 2023-05-26 DIAGNOSIS — B351 Tinea unguium: Secondary | ICD-10-CM | POA: Diagnosis not present

## 2023-05-26 DIAGNOSIS — B078 Other viral warts: Secondary | ICD-10-CM | POA: Diagnosis not present

## 2023-05-26 MED ORDER — TERBINAFINE HCL 250 MG PO TABS: 250.0000 mg | ORAL_TABLET | Freq: Every day | ORAL | 1 refills | Status: AC

## 2023-05-26 MED ORDER — KETOCONAZOLE 2 % EX CREA: TOPICAL_CREAM | CUTANEOUS | 5 refills | Status: AC

## 2023-05-26 NOTE — Patient Instructions (Addendum)
Cryotherapy Aftercare  Wash gently with soap and water everyday.   Apply Vaseline and Band-Aid daily until healed.   Viral Warts  Viral warts are growths of the skin caused by viral infection of the skin. If you have been given the diagnosis of viral warts or molluscum contagiosum there are a few things that you must understand about your condition:  There is no guaranteed treatment method available for this condition. Multiple treatments may be required, The treatments may be time consuming and require multiple visits to the dermatology office. The treatment may be expensive. You will be charged each time you come into the office to have the spots treated. The treated areas may develop new lesions further complicating treatment. The treated areas may leave a scar. There is no guarantee that even after multiple treatments that the spots will be successfully treated. These are caused by a viral infection and can be spread to other areas of the skin and to other people by direct contact. Therefore, new spots may occur.   Terbinafine Counseling  Terbinafine is an anti-fungal medicine that can be applied to the skin (over the counter) or taken by mouth (prescription) to treat fungal infections. The pill version is often used to treat fungal infections of the nails or scalp. While most people do not have any side effects from taking terbinafine pills, some possible side effects of the medicine can include taste changes, headache, loss of smell, vision changes, nausea, vomiting, or diarrhea.   Rare side effects can include irritation of the liver, allergic reaction, or decrease in blood counts (which may show up as not feeling well or developing an infection). If you are concerned about any of these side effects, please stop the medicine and call your doctor, or in the case of an emergency such as feeling very unwell, seek immediate medical care.    Recommend using Curad Mediplast pads. Cut to fit  wart or callus. Cover with Elastoplast waterproof tape or any waterproof band-aid. Change every 3 to 4 days, or sooner if necessary.  Treatment may require several months of regular use before results are seen.   Due to recent changes in healthcare laws, you may see results of your pathology and/or laboratory studies on MyChart before the doctors have had a chance to review them. We understand that in some cases there may be results that are confusing or concerning to you. Please understand that not all results are received at the same time and often the doctors may need to interpret multiple results in order to provide you with the best plan of care or course of treatment. Therefore, we ask that you please give [place] 2 business days to thoroughly review all your results before contacting the office for clarification. Should we see a critical lab result, you will be contacted sooner.   If You Need Anything After Your Visit  If you have any questions or concerns for your doctor, please call our main line at [phone] and press option 4 to reach your doctor's medical assistant. If no one answers, please leave a voicemail as directed and we will return your call as soon as possible. Messages left after 4 pm will be answered the following business day.   You may also send [place] a message via MyChart. We typically respond to MyChart messages within 1-2 business days.  For prescription refills, please ask your pharmacy to contact our office. Our fax number is [phone].  If you have an urgent issue when  the clinic is closed that cannot wait until the next business day, you can page your doctor at the number below.    Please note that while we do our best to be available for urgent issues outside of office hours, we are not available 24/7.   If you have an urgent issue and are unable to reach [place], you may choose to seek medical care at your doctor's office, retail clinic, urgent care center, or emergency  room.  If you have a medical emergency, please immediately call 911 or go to the emergency department.  Pager Numbers  - Dr. Gwen Pounds: [phone]  - Dr. Roseanne Reno: [phone]  - Dr. Katrinka Blazing: [phone]   In the event of inclement weather, please call our main line at [phone] for an update on the status of any delays or closures.  Dermatology Medication Tips: Please keep the boxes that topical medications come in in order to help keep track of the instructions about where and how to use these. Pharmacies typically print the medication instructions only on the boxes and not directly on the medication tubes.   If your medication is too expensive, please contact our office at [phone] option 4 or send [place] a message through MyChart.   We are unable to tell what your co-pay for medications will be in advance as this is different depending on your insurance coverage. However, we may be able to find a substitute medication at lower cost or fill out paperwork to get insurance to cover a needed medication.   If a prior authorization is required to get your medication covered by your insurance company, please allow [place] 1-2 business days to complete this process.  Drug prices often vary depending on where the prescription is filled and some pharmacies may offer cheaper prices.  The website www.goodrx.com contains coupons for medications through different pharmacies. The prices here do not account for what the cost may be with help from insurance (it may be cheaper with your insurance), but the website can give you the price if you did not use any insurance.  - You can print the associated coupon and take it with your prescription to the pharmacy.  - You may also stop by our office during regular business hours and pick up a GoodRx coupon card.  - If you need your prescription sent electronically to a different pharmacy, notify our office through [facility] or by phone at  [phone] option 4.     Si Usted Necesita Algo Despus de Su Visita  Tambin puede enviarnos un mensaje a travs de Clinical cytogeneticist. Por lo general respondemos a los mensajes de MyChart en el transcurso de 1 a 2 das hbiles.  Para renovar recetas, por favor pida a su farmacia que se ponga en contacto con nuestra oficina. Annie Sable de fax es [place] [phone].  Si tiene un asunto urgente cuando la clnica est cerrada y que no puede esperar hasta el siguiente da hbil, puede llamar/localizar a su doctor(a) al nmero que aparece a continuacin.   Por favor, tenga en cuenta que aunque hacemos todo lo posible para estar disponibles para asuntos urgentes fuera del horario de [place], no estamos disponibles las 24 horas del da, los 7 [address]  [address] de la [place].   Si tiene un problema urgente y no puede comunicarse con nosotros, puede optar por buscar atencin mdica  en el consultorio de su doctor(a), en una clnica privada, en un centro de atencin urgente o en una sala de emergencias.  Si  tiene una emergencia mdica, por favor llame inmediatamente al 911 o vaya a la sala de emergencias.  Nmeros de bper  - Dr. Gwen Pounds: [phone]  - Dra. Roseanne Reno: [phone]  - Dr. Katrinka Blazing: [phone]   En caso de inclemencias del tiempo, por favor llame a Lacy Duverney principal al [phone] para una actualizacin sobre el [place] de cualquier retraso o cierre.  Consejos para la medicacin en dermatologa: Por favor, guarde las cajas en las que vienen los medicamentos de uso tpico para ayudarle a seguir las instrucciones sobre dnde y cmo usarlos. Las farmacias generalmente imprimen las instrucciones del medicamento slo en las cajas y no directamente en los tubos del [place].   Si su medicamento es muy caro, por favor, pngase en contacto con Rolm Gala llamando al [phone] y presione la opcin 4 o envenos un mensaje a travs de Clinical cytogeneticist.   No podemos decirle cul ser su copago por los  medicamentos por adelantado ya que esto es diferente dependiendo de la cobertura de su seguro. Sin embargo, es posible que podamos encontrar un medicamento sustituto a Audiological scientist un formulario para que el seguro cubra el medicamento que se considera necesario.   Si se requiere una autorizacin previa para que su compaa de seguros [place] su medicamento, por favor permtanos de 1 a 2 das hbiles para completar [address]  Los precios de los medicamentos varan con frecuencia dependiendo del Environmental consultant de dnde se surte la receta y alguna farmacias pueden ofrecer precios ms baratos.  El sitio web www.goodrx.com tiene cupones para medicamentos de Health and safety inspector. Los precios aqu no tienen en cuenta lo que podra costar con la ayuda del seguro (puede ser ms barato con su seguro), pero el sitio web puede darle el precio si no utiliz Tourist information centre manager.  - Puede imprimir el cupn correspondiente y llevarlo con su receta a la farmacia.  - Tambin puede pasar por nuestra oficina durante el horario de atencin regular y Education officer, museum una tarjeta de cupones de GoodRx.  - Si necesita que su receta se enve electrnicamente a una farmacia diferente, informe a nuestra oficina a travs de MyChart de [place] o por telfono llamando al [phone] y presione la opcin 4.

## 2023-05-26 NOTE — Progress Notes (Unsigned)
Follow-Up Visit   Subjective  Leslie Lowe is a 57 y.o. female who presents for the following: Throbbing discolored area on the right thumb nail. Started this summer after she took SNS nails off. White area of nail has gotten larger. She also has an itchy rash under the breasts. She is using pimecrolimus cream and Zeasorb AF powder, but not improving.    The following portions of the chart were reviewed this encounter and updated as appropriate: medications, allergies, medical history  Review of Systems:  No other skin or systemic complaints except as noted in HPI or Assessment and Plan.  Objective  Well appearing patient in no apparent distress; mood and affect are within normal limits.  A focused examination was performed of the following areas: Face, inframammary, nails  Relevant physical exam findings are noted in the Assessment and Plan.  R index fingertip 4 mm firm flesh papule  Right Thumbnail Nail dystrophy       Assessment & Plan   Other viral warts R index fingertip  vs Callous  Viral Wart (HPV) Counseling  Discussed viral / HPV (Human Papilloma Virus) etiology and risk of spread /infectivity to other areas of body as well as to other people.  Multiple treatments and methods may be required to clear warts and it is possible treatment may not be successful.  Treatment risks include discoloration; scarring and there is still potential for wart recurrence.  Destruction of lesion - R index fingertip  Destruction method: cryotherapy   Informed consent: discussed and consent obtained   Lesion destroyed using liquid nitrogen: Yes   Region frozen until ice ball extended beyond lesion: Yes   Outcome: patient tolerated procedure well with no complications   Post-procedure details: wound care instructions given   Additional details:  Prior to procedure, discussed risks of blister formation, small wound, skin dyspigmentation, or rare scar following cryotherapy.  Recommend Vaseline ointment to treated areas while healing.   Tinea unguium Right Thumbnail  Start terbinafine 250 mg take 1 po every day with food x 2 months dsp #30 1Rf. LFTs reviewed from 04/21/2023, wnl  Discussed adding Kerydin topical. Patient will try oral first and add topical if not improved.   Terbinafine Counseling  Terbinafine is an anti-fungal medicine that can be applied to the skin (over the counter) or taken by mouth (prescription) to treat fungal infections. The pill version is often used to treat fungal infections of the nails or scalp. While most people do not have any side effects from taking terbinafine pills, some possible side effects of the medicine can include taste changes, headache, loss of smell, vision changes, nausea, vomiting, or diarrhea.   Rare side effects can include irritation of the liver, allergic reaction, or decrease in blood counts (which may show up as not feeling well or developing an infection). If you are concerned about any of these side effects, please stop the medicine and call your doctor, or in the case of an emergency such as feeling very unwell, seek immediate medical care.    terbinafine (LAMISIL) 250 MG tablet - Right Thumbnail Take 1 tablet (250 mg total) by mouth daily. Take with food.  INTERTRIGO Exam Erythematous macerated patches  Chronic and persistent condition with duration or expected duration over one year. Condition is bothersome/symptomatic for patient. Currently flared.   Intertrigo is a chronic recurrent rash that occurs in skin fold areas that may be associated with friction; heat; moisture; yeast; fungus; and bacteria.  It is exacerbated  by increased movement / activity; sweating; and higher atmospheric temperature.  Treatment Plan Start ketoconazole 2% cream Apply to affected areas rash under breast    Return in about 2 months (around 07/26/2023) for tinea R thumb nail.  ICherlyn Labella, CMA, am acting as  scribe for Willeen Niece, MD .   Documentation: I have reviewed the above documentation for accuracy and completeness, and I agree with the above.  Willeen Niece, MD

## 2023-05-27 ENCOUNTER — Other Ambulatory Visit: Payer: Self-pay

## 2023-05-27 ENCOUNTER — Encounter: Payer: Self-pay | Admitting: Dermatology

## 2023-05-27 MED ORDER — TAVABOROLE 5 % EX SOLN: 1.0000 | Freq: Every day | CUTANEOUS | 6 refills | Status: DC

## 2023-05-27 NOTE — Progress Notes (Signed)
Leslie Lowe

## 2023-06-02 ENCOUNTER — Other Ambulatory Visit: Payer: Self-pay | Admitting: Surgery

## 2023-06-05 ENCOUNTER — Other Ambulatory Visit: Payer: Self-pay | Admitting: Surgery

## 2023-06-08 ENCOUNTER — Encounter
Admission: RE | Admit: 2023-06-08 | Discharge: 2023-06-08 | Disposition: A | Discharge status: Home / Self Care | Payer: BC Managed Care – PPO | Source: Ambulatory Visit | Attending: Surgery | Admitting: Surgery

## 2023-06-08 ENCOUNTER — Other Ambulatory Visit: Payer: Self-pay

## 2023-06-08 VITALS — Ht 66.0 in | Wt 175.0 lb

## 2023-06-08 DIAGNOSIS — Z01812 Encounter for preprocedural laboratory examination: Secondary | ICD-10-CM

## 2023-06-08 DIAGNOSIS — I341 Nonrheumatic mitral (valve) prolapse: Secondary | ICD-10-CM

## 2023-06-08 HISTORY — DX: Anxiety disorder, unspecified: F41.9

## 2023-06-08 HISTORY — DX: Depression, unspecified: F32.A

## 2023-06-08 HISTORY — DX: Cardiac murmur, unspecified: R01.1

## 2023-06-08 NOTE — Patient Instructions (Addendum)
Your procedure is scheduled [id number] November 19  Report to the Registration Desk on the 1st floor of the [facility] To find out your arrival time, please call [phone] between 1PM - 3PM on:  Monday November 18  If your arrival time is 6:00 am, do not arrive before that time as the [facility] entrance doors do not open until 6:00 am.  REMEMBER: Instructions that are not followed completely may result in serious medical risk, up to and including death; or upon the discretion of your surgeon and anesthesiologist your surgery may need to be rescheduled.  Do not eat food after midnight the night before surgery.  No gum chewing or hard candies.  You may however, drink CLEAR liquids up to 2 hours before you are scheduled to arrive for your surgery. Do not drink anything within 2 hours of your scheduled arrival time.  Clear liquids include: - water  - apple juice without pulp - gatorade (not RED colors) - black coffee or tea (Do NOT add milk or creamers to the coffee or tea) Do NOT drink anything that is not on this list.  In addition, your doctor has ordered for you to drink the provided:  Ensure Pre-Surgery Clear Carbohydrate Drink  Drinking this carbohydrate drink up to two hours before surgery helps to reduce insulin resistance and improve patient outcomes. Please complete drinking 2 hours before scheduled arrival time.  One week prior to surgery:  Tuesday November 12  Stop Anti-inflammatories (NSAIDS) such as Advil, Aleve, Ibuprofen, Motrin, Naproxen, Naprosyn and Aspirin based products such as Excedrin, Goody's Powder, BC Powder. aspirin-acetaminophen-caffeine (EXCEDRIN MIGRAINE)  ferrous gluconate (FERGON)  fexofenadine (ALLEGRA)  fluticasone (FLONASE)  Stop ANY OVER THE COUNTER supplements until after surgery.  You may however, continue to take Tylenol if needed for pain up until the day of surgery.   Continue taking all of your other prescription medications up until  the day of surgery.  ON THE DAY OF SURGERY ONLY TAKE THESE MEDICATIONS WITH SIPS OF WATER:  Multiple Vitamin (MULTIVITAMIN WITH MINERALS)  omeprazole (PRILOSEC)  solifenacin (VESICARE)  terbinafine (LAMISIL)  venlafaxine XR (EFFEXOR-XR)   Use inhalers on the day of surgery and bring to the hospital. albuterol (PROAIR HFA)   No Alcohol for 24 hours before or after surgery.  No Smoking including e-cigarettes for 24 hours before surgery.  No chewable tobacco products for at least 6 hours before surgery.  No nicotine patches on the day of surgery.  Do not use any "recreational" drugs for at least a week (preferably 2 weeks) before your surgery.  Please be advised that the combination of cocaine and anesthesia may have negative outcomes, up to and including death. If you test positive for cocaine, your surgery will be cancelled.  On the morning of surgery brush your teeth with toothpaste and water, you may rinse your mouth with mouthwash if you wish. Do not swallow any toothpaste or mouthwash.  Use CHG Soap or wipes as directed on instruction sheet.  Do not wear jewelry, make-up, hairpins, clips or nail polish.  For welded (permanent) jewelry: bracelets, anklets, waist bands, etc.  Please have this removed prior to surgery.  If it is not removed, there is a chance that hospital personnel will need to cut it off on the day of surgery.  Do not wear lotions, powders, or perfumes.   Do not shave body hair from the neck down 48 hours before surgery.  Do not bring valuables to the hospital. [facility] is not responsible for any missing/lost belongings or valuables.   Notify your doctor if there is any change in your medical condition (cold, fever, infection).  Wear comfortable clothing (specific to your surgery type) to the hospital.  After surgery, you can help prevent lung complications by doing breathing exercises.  Take deep breaths and cough every 1-2 hours. Your doctor may  order a device called an Incentive Spirometer to help you take deep breaths.  If you are being discharged the day of surgery, you will not be allowed to drive home. You will need a responsible individual to drive you home and stay with you for 24 hours after surgery.   If you are taking public transportation, you will need to have a responsible individual with you.  Please call the Pre-admissions Testing Dept. at [phone] if you have any questions about these instructions.  Surgery Visitation Policy:  Patients having surgery or a procedure may have two visitors.  Children under the age of 90 must have an adult with them who is not the patient.           Preparing for Surgery with CHLORHEXIDINE GLUCONATE (CHG) Soap  Chlorhexidine Gluconate (CHG) Soap  o An antiseptic cleaner that kills germs and bonds with the skin to continue killing germs even after washing  o Used for showering the night before surgery and morning of surgery  Before surgery, you can play an important role by reducing the number of germs on your skin.  CHG (Chlorhexidine gluconate) soap is an antiseptic cleanser which kills germs and bonds with the skin to continue killing germs even after washing.  Please do not use if you have an allergy to CHG or antibacterial soaps. If your skin becomes reddened/irritated stop using the CHG.  1. Shower the NIGHT BEFORE SURGERY and the MORNING OF SURGERY with CHG soap.  2. If you choose to wash your hair, wash your hair first as usual with your normal shampoo.  3. After shampooing, rinse your hair and body thoroughly to remove the shampoo.  4. Use CHG as you would any other liquid soap. You can apply CHG directly to the skin and wash gently with a scrungie or a clean washcloth.  5. Apply the CHG soap to your body only from the neck down. Do not use on open wounds or open sores. Avoid contact with your eyes, ears, mouth, and genitals (private parts). Wash face and  genitals (private parts) with your normal soap.  6. Wash thoroughly, paying special attention to the area where your surgery will be performed.  7. Thoroughly rinse your body with warm water.  8. Do not shower/wash with your normal soap after using and rinsing off the CHG soap.  9. Pat yourself dry with a clean towel.  10. Wear clean pajamas to bed the night before surgery.  12. Place clean sheets on your bed the night of your first shower and do not sleep with pets.  13. Shower again with the CHG soap on the day of surgery prior to arriving at the hospital.  14. Do not apply any deodorants/lotions/powders.  15. Please wear clean clothes to the hospital.   How to Use an Incentive Spirometer  An incentive spirometer is a tool that measures how well you are filling your lungs with each breath. Learning to take long, deep breaths using this tool can help you keep your lungs clear and active. This may help to reverse or lessen your chance of  developing breathing (pulmonary) problems, especially infection. You may be asked to use a spirometer: After a surgery. If you have a lung problem or a history of smoking. After a long period of time when you have been unable to move or be active. If the spirometer includes an indicator to show the highest number that you have reached, your health care provider or respiratory therapist will help you set a goal. Keep a log of your progress as told by your health care provider. What are the risks? Breathing too quickly may cause dizziness or cause you to pass out. Take your time so you do not get dizzy or light-headed. If you are in pain, you may need to take pain medicine before doing incentive spirometry. It is harder to take a deep breath if you are having pain. How to use your incentive spirometer  Sit up on the edge of your bed or on a chair. Hold the incentive spirometer so that it is in an upright position. Before you use the spirometer,  breathe out normally. Place the mouthpiece in your mouth. Make sure your lips are closed tightly around it. Breathe in slowly and as deeply as you can through your mouth, causing the piston or the ball to rise toward the top of the chamber. Hold your breath for 3-5 seconds, or for as long as possible. If the spirometer includes a coach indicator, use this to guide you in breathing. Slow down your breathing if the indicator goes above the marked areas. Remove the mouthpiece from your mouth and breathe out normally. The piston or ball will return to the bottom of the chamber. Rest for a few seconds, then repeat the steps 10 or more times. Take your time and take a few normal breaths between deep breaths so that you do not get dizzy or light-headed. Do this every 1-2 hours when you are awake. If the spirometer includes a goal marker to show the highest number you have reached (best effort), use this as a goal to work toward during each repetition. After each set of 10 deep breaths, cough a few times. This will help to make sure that your lungs are clear. If you have an incision on your chest or abdomen from surgery, place a pillow or a rolled-up towel firmly against the incision when you cough. This can help to reduce pain while taking deep breaths and coughing. General tips When you are able to get out of bed: Walk around often. Continue to take deep breaths and cough in order to clear your lungs. Keep using the incentive spirometer until your health care provider says it is okay to stop using it. If you have been in the hospital, you may be told to keep using the spirometer at home. Contact a health care provider if: You are having difficulty using the spirometer. You have trouble using the spirometer as often as instructed. Your pain medicine is not giving enough relief for you to use the spirometer as told. You have a fever. Get help right away if: You develop shortness of breath. You  develop a cough with bloody mucus from the lungs. You have fluid or blood coming from an incision site after you cough. Summary An incentive spirometer is a tool that can help you learn to take long, deep breaths to keep your lungs clear and active. You may be asked to use a spirometer after a surgery, if you have a lung problem or a history of smoking, or  if you have been inactive for a long period of time. Use your incentive spirometer as instructed every 1-2 hours while you are awake. If you have an incision on your chest or abdomen, place a pillow or a rolled-up towel firmly against your incision when you cough. This will help to reduce pain. Get help right away if you have shortness of breath, you cough up bloody mucus, or blood comes from your incision when you cough. This information is not intended to replace advice given to you by your health care provider. Make sure you discuss any questions you have with your health care provider. Document Revised: 10/03/2019 Document Reviewed: 10/03/2019 Elsevier Patient Education  2023 [employer].

## 2023-06-09 ENCOUNTER — Encounter
Admission: RE | Admit: 2023-06-09 | Discharge: 2023-06-09 | Disposition: A | Discharge status: Home / Self Care | Payer: BC Managed Care – PPO | Source: Ambulatory Visit | Attending: Surgery | Admitting: Surgery

## 2023-06-09 DIAGNOSIS — Z01810 Encounter for preprocedural cardiovascular examination: Secondary | ICD-10-CM | POA: Diagnosis present

## 2023-06-09 DIAGNOSIS — Z01812 Encounter for preprocedural laboratory examination: Secondary | ICD-10-CM

## 2023-06-09 DIAGNOSIS — I341 Nonrheumatic mitral (valve) prolapse: Secondary | ICD-10-CM | POA: Insufficient documentation

## 2023-06-16 ENCOUNTER — Encounter: Admission: RE | Payer: Self-pay | Source: Home / Self Care

## 2023-06-16 ENCOUNTER — Ambulatory Visit: Admission: RE | Admit: 2023-06-16 | Payer: BC Managed Care – PPO | Source: Home / Self Care | Admitting: Surgery

## 2023-06-16 SURGERY — SHOULDER ARTHROSCOPY WITH SUBACROMIAL DECOMPRESSION, ROTATOR CUFF REPAIR AND BICEP TENDON REPAIR
Anesthesia: Choice | Site: Shoulder | Laterality: Right

## 2023-07-21 ENCOUNTER — Other Ambulatory Visit: Payer: Self-pay | Admitting: Surgery

## 2023-07-28 ENCOUNTER — Encounter: Payer: Self-pay | Admitting: *Deleted

## 2023-07-28 ENCOUNTER — Other Ambulatory Visit: Payer: Self-pay

## 2023-07-28 ENCOUNTER — Encounter
Admission: RE | Admit: 2023-07-28 | Discharge: 2023-07-28 | Disposition: A | Discharge status: Home / Self Care | Payer: BC Managed Care – PPO | Source: Ambulatory Visit | Attending: Surgery | Admitting: Surgery

## 2023-07-28 NOTE — Patient Instructions (Addendum)
 Your procedure is scheduled on: Thursday 08/06/23 Report to the Registration Desk on the 1st floor [facility]. To find out your arrival time, please call [phone] between 1PM - 3PM on: Wednesday 08/05/23  If your arrival time is 6:00 am, do not arrive before that time as the [facility] entrance doors do not open until 6:00 am.  REMEMBER: Instructions that are not followed completely may result in serious medical risk, up to and including death; or upon the discretion of your surgeon and anesthesiologist your surgery may need to be rescheduled.  Do not eat food after midnight the night before surgery.  No gum chewing or hard candies.  You may however, drink CLEAR liquids up to 2 hours before you are scheduled to arrive for your surgery. Do not drink anything within 2 hours of your scheduled arrival time.  Clear liquids include: - water  - apple juice without pulp - gatorade (not RED colors) - black coffee or tea (Do NOT add milk or creamers to the coffee or tea) Do NOT drink anything that is not on this list.  In addition, your doctor has ordered for you to drink the provided:  Ensure Pre-Surgery Clear Carbohydrate Drink  Drinking this carbohydrate drink up to two hours before surgery helps to reduce insulin resistance and improve patient outcomes. Please complete drinking 2 hours before scheduled arrival time.  One week prior to surgery: Stop Anti-inflammatories (NSAIDS) such as Advil, Aleve, Ibuprofen, Motrin, Naproxen, Naprosyn and Aspirin based products such as Excedrin, Goody's Powder, BC Powder. Stop ANY OVER THE COUNTER supplements until after surgery.  You may however, continue to take Tylenol  if needed for pain up until the day of surgery.  Continue taking all of your other prescription medications up until the day of surgery.  ON THE DAY OF SURGERY ONLY TAKE THESE MEDICATIONS WITH SIPS OF WATER:  omeprazole (PRILOSEC) 40 MG  venlafaxine XR (EFFEXOR-XR) 75 MG    Use inhalers on the day of surgery and bring to the hospital. albuterol (PROAIR HFA) 108 (90 Base) MCG/ACT inhaler    No Alcohol for 24 hours before or after surgery.  No Smoking including e-cigarettes for 24 hours before surgery.  No chewable tobacco products for at least 6 hours before surgery.  No nicotine patches on the day of surgery.  Do not use any recreational drugs for at least a week (preferably 2 weeks) before your surgery.  Please be advised that the combination of cocaine and anesthesia may have negative outcomes, up to and including death. If you test positive for cocaine, your surgery will be cancelled.  On the morning of surgery brush your teeth with toothpaste and water, you may rinse your mouth with mouthwash if you wish. Do not swallow any toothpaste or mouthwash.  Use CHG Soap or wipes as directed on instruction sheet.  Do not wear jewelry, make-up, hairpins, clips or nail polish.  For welded (permanent) jewelry: bracelets, anklets, waist bands, etc.  Please have this removed prior to surgery.  If it is not removed, there is a chance that hospital personnel will need to cut it off on the day of surgery.  Do not wear lotions, powders, or perfumes.   Do not shave body hair from the neck down 48 hours before surgery.  Contact lenses, hearing aids and dentures may not be worn into surgery.  Do not bring valuables to the hospital. [facility] is not responsible for any missing/lost belongings or valuables.  Notify your doctor if there is any change in your medical condition (cold, fever, infection).  Wear comfortable clothing (specific to your surgery type) to the hospital.  After surgery, you can help prevent lung complications by doing breathing exercises.  Take deep breaths and cough every 1-2 hours. Your doctor may order a device called an Incentive Spirometer to help you take deep breaths. When coughing or sneezing, hold a pillow firmly against your  incision with both hands. This is called "splinting." Doing this helps protect your incision. It also decreases belly discomfort.  If you are being admitted to the hospital overnight, leave your suitcase in the car. After surgery it may be brought to your room.  In case of increased patient census, it may be necessary for you, the patient, to continue your postoperative care in the Same Day Surgery department.  If you are being discharged the day of surgery, you will not be allowed to drive home. You will need a responsible individual to drive you home and stay with you for 24 hours after surgery.   If you are taking public transportation, you will need to have a responsible individual with you.  Please call the Pre-admissions Testing Dept. at [phone] if you have any questions about these instructions.  Surgery Visitation Policy:  Patients having surgery or a procedure may have two visitors.  Children under the age of 85 must have an adult with them who is not the patient.  Inpatient Visitation:    Visiting hours are 7 a.m. to 8 p.m. Up to four visitors are allowed at one time in a patient room. The visitors may rotate out with other people during the day.  One visitor age 3 or older may stay with the patient overnight and must be in the room by 8 p.m.    Preparing for Surgery with CHLORHEXIDINE  GLUCONATE (CHG) Soap  Chlorhexidine  Gluconate (CHG) Soap  o An antiseptic cleaner that kills germs and bonds with the skin to continue killing germs even after washing  o Used for showering the night before surgery and morning of surgery  Before surgery, you can play an important role by reducing the number of germs on your skin.  CHG (Chlorhexidine  gluconate) soap is an antiseptic cleanser which kills germs and bonds with the skin to continue killing germs even after washing.  Please do not use if you have an allergy to CHG or antibacterial soaps. If your skin becomes  reddened/irritated stop using the CHG.  1. Shower the NIGHT BEFORE SURGERY and the MORNING OF SURGERY with CHG soap.  2. If you choose to wash your hair, wash your hair first as usual with your normal shampoo.  3. After shampooing, rinse your hair and body thoroughly to remove the shampoo.  4. Use CHG as you would any other liquid soap. You can apply CHG directly to the skin and wash gently with a scrungie or a clean washcloth.  5. Apply the CHG soap to your body only from the neck down. Do not use on open wounds or open sores. Avoid contact with your eyes, ears, mouth, and genitals (private parts). Wash face and genitals (private parts) with your normal soap.  6. Wash thoroughly, paying special attention to the area where your surgery will be performed.  7. Thoroughly rinse your body with warm water.  8. Do not shower/wash with your normal soap after using and rinsing off the CHG soap.  9. Pat yourself dry with a clean towel.  10. Wear clean pajamas to bed the night before surgery.  12. Place clean sheets on your bed the night of your first shower and do not sleep with pets.  13. Shower again with the CHG soap on the day of surgery prior to arriving at the hospital.  14. Do not apply any deodorants/lotions/powders.  15. Please wear clean clothes to the hospital.  How to Use an Incentive Spirometer  An incentive spirometer is a tool that measures how well you are filling your lungs with each breath. Learning to take long, deep breaths using this tool can help you keep your lungs clear and active. This may help to reverse or lessen your chance of developing breathing (pulmonary) problems, especially infection. You may be asked to use a spirometer: After a surgery. If you have a lung problem or a history of smoking. After a long period of time when you have been unable to move or be active. If the spirometer includes an indicator to show the highest number that you have reached,  your health care provider or respiratory therapist will help you set a goal. Keep a log of your progress as told by your health care provider. What are the risks? Breathing too quickly may cause dizziness or cause you to pass out. Take your time so you do not get dizzy or light-headed. If you are in pain, you may need to take pain medicine before doing incentive spirometry. It is harder to take a deep breath if you are having pain. How to use your incentive spirometer  Sit up on the edge of your bed or on a chair. Hold the incentive spirometer so that it is in an upright position. Before you use the spirometer, breathe out normally. Place the mouthpiece in your mouth. Make sure your lips are closed tightly around it. Breathe in slowly and as deeply as you can through your mouth, causing the piston or the ball to rise toward the top of the chamber. Hold your breath for 3-5 seconds, or for as long as possible. If the spirometer includes a coach indicator, use this to guide you in breathing. Slow down your breathing if the indicator goes above the marked areas. Remove the mouthpiece from your mouth and breathe out normally. The piston or ball will return to the bottom of the chamber. Rest for a few seconds, then repeat the steps 10 or more times. Take your time and take a few normal breaths between deep breaths so that you do not get dizzy or light-headed. Do this every 1-2 hours when you are awake. If the spirometer includes a goal marker to show the highest number you have reached (best effort), use this as a goal to work toward during each repetition. After each set of 10 deep breaths, cough a few times. This will help to make sure that your lungs are clear. If you have an incision on your chest or abdomen from surgery, place a pillow or a rolled-up towel firmly against the incision when you cough. This can help to reduce pain while taking deep breaths and coughing. General tips When you are  able to get out of bed: Walk around often. Continue to take deep breaths and cough in order to clear your lungs. Keep using the incentive spirometer until your health care provider says it is okay to stop using it. If you have been in the hospital, you may be told to keep using the spirometer at home. Contact a health care  provider if: You are having difficulty using the spirometer. You have trouble using the spirometer as often as instructed. Your pain medicine is not giving enough relief for you to use the spirometer as told. You have a fever. Get help right away if: You develop shortness of breath. You develop a cough with bloody mucus from the lungs. You have fluid or blood coming from an incision site after you cough. Summary An incentive spirometer is a tool that can help you learn to take long, deep breaths to keep your lungs clear and active. You may be asked to use a spirometer after a surgery, if you have a lung problem or a history of smoking, or if you have been inactive for a long period of time. Use your incentive spirometer as instructed every 1-2 hours while you are awake. If you have an incision on your chest or abdomen, place a pillow or a rolled-up towel firmly against your incision when you cough. This will help to reduce pain. Get help right away if you have shortness of breath, you cough up bloody mucus, or blood comes from your incision when you cough. This information is not intended to replace advice given to you by your health care provider. Make sure you discuss any questions you have with your health care provider. Document Revised: 10/03/2019 Document Reviewed: 10/03/2019 Elsevier Patient Education  2023 [employer].

## 2023-08-04 ENCOUNTER — Ambulatory Visit: Payer: 59 | Admitting: Dermatology

## 2023-08-04 DIAGNOSIS — L230 Allergic contact dermatitis due to metals: Secondary | ICD-10-CM | POA: Diagnosis not present

## 2023-08-04 DIAGNOSIS — B351 Tinea unguium: Secondary | ICD-10-CM | POA: Diagnosis not present

## 2023-08-04 DIAGNOSIS — B078 Other viral warts: Secondary | ICD-10-CM

## 2023-08-04 DIAGNOSIS — L304 Erythema intertrigo: Secondary | ICD-10-CM | POA: Diagnosis not present

## 2023-08-04 DIAGNOSIS — B079 Viral wart, unspecified: Secondary | ICD-10-CM | POA: Diagnosis not present

## 2023-08-04 MED ORDER — DESONIDE 0.05 % EX CREA: 1.0000 | TOPICAL_CREAM | Freq: Every day | CUTANEOUS | 11 refills | Status: AC | PRN

## 2023-08-04 MED ORDER — TAVABOROLE 5 % EX SOLN: CUTANEOUS | 11 refills | Status: DC

## 2023-08-04 MED ORDER — FLUCONAZOLE 200 MG PO TABS: 200.0000 mg | ORAL_TABLET | Freq: Every day | ORAL | 0 refills | Status: DC

## 2023-08-04 NOTE — Patient Instructions (Addendum)
 Can complete terbinafine  medication before starting  Fluconazole  200 mg tablet - take 1 by mouth weekly until finished with prescription Can start kerydin  solution - apply topically to nail nightly    For wart at right index finger   Start otc salicylic acid apply to wart nightly and cover with bandage    Intertrigo is a chronic recurrent rash that occurs in skin fold areas that may be associated with friction; heat; moisture; yeast; fungus; and bacteria.  It is exacerbated by increased movement / activity; sweating; and higher atmospheric temperature.   Can continue ketoconazole  2 % cream nightly as needed to rash areas     Recommend OTC Zeasorb AF powder to body folds daily after shower.  It is often found in the athlete's foot section in the pharmacy.  Avoid using powders that contain cornstarch  Due to recent changes in healthcare laws, you may see results of your pathology and/or laboratory studies on MyChart before the doctors have had a chance to review them. We understand that in some cases there may be results that are confusing or concerning to you. Please understand that not all results are received at the same time and often the doctors may need to interpret multiple results in order to provide you with the best plan of care or course of treatment. Therefore, we ask that you please give us  2 business days to thoroughly review all your results before contacting the office for clarification. Should we see a critical lab result, you will be contacted sooner.   If You Need Anything After Your Visit  If you have any questions or concerns for your doctor, please call our main line at [phone] and press option 4 to reach your doctor's medical assistant. If no one answers, please leave a voicemail as directed and we will return your call as soon as possible. Messages left after 4 pm will be answered the following business day.   You may also send us  a message via MyChart. We  typically respond to MyChart messages within 1-2 business days.  For prescription refills, please ask your pharmacy to contact our office. Our fax number is [phone].  If you have an urgent issue when the clinic is closed that cannot wait until the next business day, you can page your doctor at the number below.    Please note that while we do our best to be available for urgent issues outside of office hours, we are not available 24/7.   If you have an urgent issue and are unable to reach us , you may choose to seek medical care at your doctor's office, retail clinic, urgent care center, or emergency room.  If you have a medical emergency, please immediately call 911 or go to the emergency department.  Pager Numbers  - Dr. Hester: [phone]  - Dr. Jackquline: [phone]  - Dr. Claudene: [phone]   In the event of inclement weather, please call our main line at [phone] for an update on the status of any delays or closures.  Dermatology Medication Tips: Please keep the boxes that topical medications come in in order to help keep track of the instructions about where and how to use these. Pharmacies typically print the medication instructions only on the boxes and not directly on the medication tubes.   If your medication is too expensive, please contact our office at [phone] option 4 or send us  a message through MyChart.   We are unable to tell what your co-pay for  medications will be in advance as this is different depending on your insurance coverage. However, we may be able to find a substitute medication at lower cost or fill out paperwork to get insurance to cover a needed medication.   If a prior authorization is required to get your medication covered by your insurance company, please allow us  1-2 business days to complete this process.  Drug prices often vary depending on where the prescription is filled and some pharmacies may offer cheaper prices.  The  website www.goodrx.com contains coupons for medications through different pharmacies. The prices here do not account for what the cost may be with help from insurance (it may be cheaper with your insurance), but the website can give you the price if you did not use any insurance.  - You can print the associated coupon and take it with your prescription to the pharmacy.  - You may also stop by our office during regular business hours and pick up a GoodRx coupon card.  - If you need your prescription sent electronically to a different pharmacy, notify our office through [facility] or by phone at [phone] option 4.     Si Usted Necesita Algo Despus de Su Visita  Tambin puede enviarnos un mensaje a travs de Clinical Cytogeneticist. Por lo general respondemos a los mensajes de MyChart en el transcurso de 1 a 2 das hbiles.  Para renovar recetas, por favor pida a su farmacia que se ponga en contacto con nuestra oficina. Randi lakes de fax es [place] [phone].  Si tiene un asunto urgente cuando la clnica est cerrada y que no puede esperar hasta el siguiente da hbil, puede llamar/localizar a su doctor(a) al nmero que aparece a continuacin.   Por favor, tenga en cuenta que aunque hacemos todo lo posible para estar disponibles para asuntos urgentes fuera del horario de [place], no estamos disponibles las 24 horas del da, los 7 [address]  [address] de la [place].   Si tiene un problema urgente y no puede comunicarse con nosotros, puede optar por buscar atencin mdica  en el consultorio de su doctor(a), en una clnica privada, en un centro de atencin urgente o en una sala de emergencias.  Si tiene engineer, drilling, por favor llame inmediatamente al 911 o vaya a la sala de emergencias.  Nmeros de bper  - Dr. Hester: [phone]  - Dra. Jackquline: [phone]  - Dr. Claudene: [phone]   En caso de inclemencias del tiempo, por favor llame a landry capes principal al [phone] para una  actualizacin sobre el [place] de cualquier retraso o cierre.  Consejos para la medicacin en dermatologa: Por favor, guarde las cajas en las que vienen los medicamentos de uso tpico para ayudarle a seguir las instrucciones sobre dnde y cmo usarlos. Las farmacias generalmente imprimen las instrucciones del medicamento slo en las cajas y no directamente en los tubos del [place].   Si su medicamento es muy caro, por favor, pngase en contacto con landry rieger llamando al [phone] y presione la opcin 4 o envenos un mensaje a travs de Clinical Cytogeneticist.   No podemos decirle cul ser su copago por los medicamentos por adelantado ya que esto es diferente dependiendo de la cobertura de su seguro. Sin embargo, es posible que podamos encontrar un medicamento sustituto a audiological scientist un formulario para que el seguro cubra el medicamento que se considera necesario.   Si se requiere una autorizacin previa para que su compaa de seguros cubra su medicamento, por favor  permtanos de 1 a 2 das hbiles para completar [address]  Los precios de los medicamentos varan con frecuencia dependiendo del environmental consultant de dnde se surte la receta y alguna farmacias pueden ofrecer precios ms baratos.  El sitio web www.goodrx.com tiene cupones para medicamentos de health and safety inspector. Los precios aqu no tienen en cuenta lo que podra costar con la ayuda del seguro (puede ser ms barato con su seguro), pero el sitio web puede darle el precio si no utiliz tourist information centre manager.  - Puede imprimir el cupn correspondiente y llevarlo con su receta a la farmacia.  - Tambin puede pasar por nuestra oficina durante el horario de atencin regular y education officer, museum una tarjeta de cupones de GoodRx.  - Si necesita que su receta se enve electrnicamente a una farmacia diferente, informe a nuestra oficina a travs de MyChart de [place] o por telfono llamando al [phone] y presione la opcin 4.

## 2023-08-04 NOTE — Progress Notes (Signed)
 Follow-Up Visit   Subjective  Leslie Lowe is a 58 y.o. female who presents for the following: 2 month follow up on wart / callous at right index fingertip - patient reports still has not completely gone away.   Patient also here for follow up on tinea unguium at right thumbnail. Patient was started on terbinafine  250 mg tab for 2 months. Patient states she has not seen much improvement. No side effects.  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    The patient has spots, moles and lesions to be evaluated, some may be new or changing and the patient may have concern these could be cancer.   The following portions of the chart were reviewed this encounter and updated as appropriate: medications, allergies, medical history  Review of Systems:  No other skin or systemic complaints except as noted in HPI or Assessment and Plan.  Objective  Well appearing patient in no apparent distress; mood and affect are within normal limits.   A focused examination was performed of the following areas: Right index finger, right thumbnail   Relevant exam findings are noted in the Assessment and Plan.  right index finger 3mm firm flesh papule right index finger tip  Assessment & Plan   Tinea unguium Right Thumbnail  Exam:  minimal clearance from base when compared to baseline photos.  Continued onycholysis.   Chronic and persistent condition with duration or expected duration over one year. Condition is symptomatic/ bothersome to patient. Not currently at goal.   Plan:  Complete terbinafine  250 mg medication, has few tabs left  Once finished discussed patient can start Fluconazole  200 mg capsule by mouth once weekly. (Written daily dispense #30)  Start kerydin  solution - apply topically to affected nail nightly - not covered by insurance - patient given good rx coupon and rx sent to [facility]   Side effects of fluconazole  (diflucan )  include nausea, diarrhea, headache, dizziness, taste changes, rare risk of irritation of the liver, allergy, or decreased blood counts (which could show up as infection or tiredness).   INTERTRIGO Exam: inframammary clear today   Chronic condition with duration or expected duration over one year. Currently well-controlled.   Intertrigo is a chronic recurrent rash that occurs in skin fold areas that may be associated with friction; heat; moisture; yeast; fungus; and bacteria.  It is exacerbated by increased movement / activity; sweating; and higher atmospheric temperature.   Treatment Plan Continue ketoconazole  2% cream Apply to affected areas rash under breast once daily   Recommend OTC Zeasorb AF powder to body folds daily after shower.  It is often found in the athlete's foot section in the pharmacy.  Avoid using powders that contain cornstarch.   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 No rash today. Related Medications desonide  (DESOWEN ) 0.05 % cream Apply 1 Application topically daily as needed (nickel allergy). TINEA UNGUIUM   Related Medications terbinafine  (LAMISIL ) 250 MG tablet Take 1 tablet (250 mg total) by mouth daily. Take with food. Tavaborole  5 % SOLN Apply topically to affected nail nightly fluconazole  (DIFLUCAN ) 200 MG tablet Take 1 tablet (200 mg total) by mouth daily. VIRAL WARTS, UNSPECIFIED TYPE right index finger Viral Wart (HPV) Counseling  Discussed viral / HPV (Human Papilloma Virus) etiology and risk of spread /infectivity to other areas of body  as well as to other people.  Multiple treatments and methods may be required to clear warts and it is possible treatment may not be successful.  Treatment risks include discoloration; scarring and there is still potential for wart recurrence.  Patient defers LN2 treatment today, due to upcoming shoulder  surgery.  Recommend otc salicylic acid apply to wart nightly as directed  Return for keep follow up as scheduled .  I, Eleanor Blush, CMA, am acting as scribe for Rexene Rattler, MD.   Documentation: I have reviewed the above documentation for accuracy and completeness, and I agree with the above.  Rexene Rattler, MD

## 2023-08-06 ENCOUNTER — Ambulatory Visit: Payer: 59

## 2023-08-06 ENCOUNTER — Other Ambulatory Visit: Payer: Self-pay

## 2023-08-06 ENCOUNTER — Ambulatory Visit
Admission: RE | Admit: 2023-08-06 | Discharge: 2023-08-06 | Disposition: A | Discharge status: Home / Self Care | Payer: 59 | Attending: Surgery | Admitting: Surgery

## 2023-08-06 ENCOUNTER — Ambulatory Visit: Payer: 59 | Admitting: Urgent Care

## 2023-08-06 ENCOUNTER — Encounter
Admission: RE | Disposition: A | Discharge status: Home / Self Care | Payer: Self-pay | Source: Home / Self Care | Attending: Surgery

## 2023-08-06 ENCOUNTER — Ambulatory Visit: Payer: 59 | Admitting: General Practice

## 2023-08-06 ENCOUNTER — Encounter: Payer: Self-pay | Admitting: Surgery

## 2023-08-06 DIAGNOSIS — I341 Nonrheumatic mitral (valve) prolapse: Secondary | ICD-10-CM | POA: Diagnosis not present

## 2023-08-06 DIAGNOSIS — M19011 Primary osteoarthritis, right shoulder: Secondary | ICD-10-CM | POA: Diagnosis not present

## 2023-08-06 DIAGNOSIS — K219 Gastro-esophageal reflux disease without esophagitis: Secondary | ICD-10-CM | POA: Diagnosis not present

## 2023-08-06 DIAGNOSIS — S46011A Strain of muscle(s) and tendon(s) of the rotator cuff of right shoulder, initial encounter: Secondary | ICD-10-CM | POA: Insufficient documentation

## 2023-08-06 DIAGNOSIS — X58XXXA Exposure to other specified factors, initial encounter: Secondary | ICD-10-CM | POA: Insufficient documentation

## 2023-08-06 HISTORY — PX: SHOULDER ARTHROSCOPY WITH SUBACROMIAL DECOMPRESSION, ROTATOR CUFF REPAIR AND BICEP TENDON REPAIR: SHX5687

## 2023-08-06 SURGERY — SHOULDER ARTHROSCOPY WITH SUBACROMIAL DECOMPRESSION, ROTATOR CUFF REPAIR AND BICEP TENDON REPAIR
Anesthesia: General | Site: Shoulder | Laterality: Right

## 2023-08-06 MED ORDER — OXYCODONE HCL 5 MG/5ML PO SOLN: 5.0000 mg | Freq: Once | ORAL | Status: AC | PRN

## 2023-08-06 MED ORDER — MIDAZOLAM HCL 2 MG/2ML IJ SOLN
INTRAMUSCULAR | Status: AC
  Filled 2023-08-06: qty 2

## 2023-08-06 MED ORDER — FENTANYL CITRATE (PF) 100 MCG/2ML IJ SOLN
INTRAMUSCULAR | Status: DC | PRN
  Administered 2023-08-06 (×2): 50 ug via INTRAVENOUS

## 2023-08-06 MED ORDER — OXYCODONE HCL 5 MG PO TABS: 5.0000 mg | ORAL_TABLET | ORAL | 0 refills | Status: AC | PRN

## 2023-08-06 MED ORDER — DEXAMETHASONE SODIUM PHOSPHATE 10 MG/ML IJ SOLN
INTRAMUSCULAR | Status: AC
  Filled 2023-08-06: qty 1

## 2023-08-06 MED ORDER — SCOPOLAMINE 1 MG/3DAYS TD PT72
MEDICATED_PATCH | TRANSDERMAL | Status: AC
  Filled 2023-08-06: qty 1

## 2023-08-06 MED ORDER — ONDANSETRON HCL 4 MG PO TABS: 4.0000 mg | ORAL_TABLET | Freq: Four times a day (QID) | ORAL | Status: DC | PRN

## 2023-08-06 MED ORDER — ONDANSETRON HCL 4 MG/2ML IJ SOLN: 4.0000 mg | Freq: Four times a day (QID) | INTRAMUSCULAR | Status: DC | PRN

## 2023-08-06 MED ORDER — PHENYLEPHRINE 80 MCG/ML (10ML) SYRINGE FOR IV PUSH (FOR BLOOD PRESSURE SUPPORT)
PREFILLED_SYRINGE | INTRAVENOUS | Status: AC
  Filled 2023-08-06: qty 10

## 2023-08-06 MED ORDER — METOCLOPRAMIDE HCL 5 MG/ML IJ SOLN: 5.0000 mg | Freq: Three times a day (TID) | INTRAMUSCULAR | Status: DC | PRN

## 2023-08-06 MED ORDER — OXYCODONE HCL 5 MG PO TABS
5.0000 mg | ORAL_TABLET | Freq: Once | ORAL | Status: AC | PRN
  Administered 2023-08-06: 5 mg via ORAL

## 2023-08-06 MED ORDER — SUGAMMADEX SODIUM 200 MG/2ML IV SOLN
INTRAVENOUS | Status: DC | PRN
  Administered 2023-08-06: 200 mg via INTRAVENOUS

## 2023-08-06 MED ORDER — FENTANYL CITRATE PF 50 MCG/ML IJ SOSY
50.0000 ug | PREFILLED_SYRINGE | Freq: Once | INTRAMUSCULAR | Status: AC
  Administered 2023-08-06: 50 ug via INTRAVENOUS

## 2023-08-06 MED ORDER — OXYCODONE HCL 5 MG PO TABS
ORAL_TABLET | ORAL | Status: AC
  Filled 2023-08-06: qty 1

## 2023-08-06 MED ORDER — ONDANSETRON HCL 4 MG/2ML IJ SOLN
INTRAMUSCULAR | Status: AC
  Filled 2023-08-06: qty 2

## 2023-08-06 MED ORDER — PHENYLEPHRINE 80 MCG/ML (10ML) SYRINGE FOR IV PUSH (FOR BLOOD PRESSURE SUPPORT)
PREFILLED_SYRINGE | INTRAVENOUS | Status: DC | PRN
  Administered 2023-08-06: 80 ug via INTRAVENOUS

## 2023-08-06 MED ORDER — BUPIVACAINE HCL (PF) 0.5 % IJ SOLN
INTRAMUSCULAR | Status: AC
  Filled 2023-08-06: qty 10

## 2023-08-06 MED ORDER — ORAL CARE MOUTH RINSE: 15.0000 mL | Freq: Once | OROMUCOSAL | Status: AC

## 2023-08-06 MED ORDER — CEFAZOLIN SODIUM-DEXTROSE 2-4 GM/100ML-% IV SOLN
2.0000 g | INTRAVENOUS | Status: AC
  Administered 2023-08-06: 2 g via INTRAVENOUS

## 2023-08-06 MED ORDER — EPINEPHRINE PF 1 MG/ML IJ SOLN
INTRAMUSCULAR | Status: AC
  Filled 2023-08-06: qty 2

## 2023-08-06 MED ORDER — BUPIVACAINE HCL (PF) 0.5 % IJ SOLN
INTRAMUSCULAR | Status: DC | PRN
Start: 2023-08-06 — End: 2023-08-06
  Administered 2023-08-06: 10 mL

## 2023-08-06 MED ORDER — ACETAMINOPHEN 325 MG PO TABS: 325.0000 mg | ORAL_TABLET | Freq: Four times a day (QID) | ORAL | Status: DC | PRN

## 2023-08-06 MED ORDER — ONDANSETRON HCL 4 MG/2ML IJ SOLN
INTRAMUSCULAR | Status: DC | PRN
  Administered 2023-08-06: 4 mg via INTRAVENOUS

## 2023-08-06 MED ORDER — KETOROLAC TROMETHAMINE 30 MG/ML IJ SOLN
30.0000 mg | Freq: Once | INTRAMUSCULAR | Status: AC
  Administered 2023-08-06: 30 mg via INTRAVENOUS

## 2023-08-06 MED ORDER — PROPOFOL 10 MG/ML IV BOLUS
INTRAVENOUS | Status: DC | PRN
  Administered 2023-08-06: 150 mg via INTRAVENOUS

## 2023-08-06 MED ORDER — LACTATED RINGERS IV SOLN: INTRAVENOUS | Status: DC

## 2023-08-06 MED ORDER — ROCURONIUM BROMIDE 100 MG/10ML IV SOLN
INTRAVENOUS | Status: DC | PRN
  Administered 2023-08-06: 50 mg via INTRAVENOUS

## 2023-08-06 MED ORDER — KETOROLAC TROMETHAMINE 30 MG/ML IJ SOLN
INTRAMUSCULAR | Status: AC
  Filled 2023-08-06: qty 1

## 2023-08-06 MED ORDER — FENTANYL CITRATE (PF) 100 MCG/2ML IJ SOLN
INTRAMUSCULAR | Status: AC
  Filled 2023-08-06: qty 2

## 2023-08-06 MED ORDER — BUPIVACAINE LIPOSOME 1.3 % IJ SUSP
INTRAMUSCULAR | Status: DC | PRN
  Administered 2023-08-06: 20 mL

## 2023-08-06 MED ORDER — PROPOFOL 10 MG/ML IV BOLUS
INTRAVENOUS | Status: AC
  Filled 2023-08-06: qty 20

## 2023-08-06 MED ORDER — CEFAZOLIN SODIUM-DEXTROSE 2-4 GM/100ML-% IV SOLN
INTRAVENOUS | Status: AC
  Filled 2023-08-06: qty 100

## 2023-08-06 MED ORDER — FENTANYL CITRATE PF 50 MCG/ML IJ SOSY
PREFILLED_SYRINGE | INTRAMUSCULAR | Status: AC
  Filled 2023-08-06: qty 1

## 2023-08-06 MED ORDER — LIDOCAINE HCL (CARDIAC) PF 100 MG/5ML IV SOSY
PREFILLED_SYRINGE | INTRAVENOUS | Status: DC | PRN
  Administered 2023-08-06: 30 mg via INTRAVENOUS

## 2023-08-06 MED ORDER — DEXTROSE-SODIUM CHLORIDE 5-0.9 % IV SOLN: INTRAVENOUS | Status: DC

## 2023-08-06 MED ORDER — ROCURONIUM BROMIDE 10 MG/ML (PF) SYRINGE
PREFILLED_SYRINGE | INTRAVENOUS | Status: AC
  Filled 2023-08-06: qty 10

## 2023-08-06 MED ORDER — EPHEDRINE 5 MG/ML INJ
INTRAVENOUS | Status: AC
  Filled 2023-08-06: qty 5

## 2023-08-06 MED ORDER — BUPIVACAINE HCL (PF) 0.5 % IJ SOLN
INTRAMUSCULAR | Status: AC
  Filled 2023-08-06: qty 30

## 2023-08-06 MED ORDER — LACTATED RINGERS IV SOLN
INTRAVENOUS | Status: DC | PRN
  Administered 2023-08-06 (×2): 3000 mL

## 2023-08-06 MED ORDER — OXYCODONE HCL 5 MG PO TABS: 5.0000 mg | ORAL_TABLET | ORAL | Status: DC | PRN

## 2023-08-06 MED ORDER — DEXAMETHASONE SODIUM PHOSPHATE 10 MG/ML IJ SOLN
INTRAMUSCULAR | Status: DC | PRN
  Administered 2023-08-06: 5 mg via INTRAVENOUS

## 2023-08-06 MED ORDER — BUPIVACAINE-EPINEPHRINE 0.5% -1:200000 IJ SOLN
INTRAMUSCULAR | Status: DC | PRN
  Administered 2023-08-06: 30 mL

## 2023-08-06 MED ORDER — METOCLOPRAMIDE HCL 10 MG PO TABS: 5.0000 mg | ORAL_TABLET | Freq: Three times a day (TID) | ORAL | Status: DC | PRN

## 2023-08-06 MED ORDER — MIDAZOLAM HCL 2 MG/2ML IJ SOLN
1.0000 mg | INTRAMUSCULAR | Status: DC | PRN
  Administered 2023-08-06: 1 mg via INTRAVENOUS

## 2023-08-06 MED ORDER — BUPIVACAINE LIPOSOME 1.3 % IJ SUSP
INTRAMUSCULAR | Status: AC
  Filled 2023-08-06: qty 20

## 2023-08-06 MED ORDER — SCOPOLAMINE 1 MG/3DAYS TD PT72
1.0000 | MEDICATED_PATCH | TRANSDERMAL | Status: DC
  Administered 2023-08-06: 1.5 mg via TRANSDERMAL

## 2023-08-06 MED ORDER — EPHEDRINE SULFATE-NACL 50-0.9 MG/10ML-% IV SOSY
PREFILLED_SYRINGE | INTRAVENOUS | Status: DC | PRN
  Administered 2023-08-06: 5 mg via INTRAVENOUS

## 2023-08-06 MED ORDER — CHLORHEXIDINE GLUCONATE 0.12 % MT SOLN
15.0000 mL | Freq: Once | OROMUCOSAL | Status: AC
  Administered 2023-08-06: 15 mL via OROMUCOSAL

## 2023-08-06 MED ORDER — MIDAZOLAM HCL 2 MG/2ML IJ SOLN
INTRAMUSCULAR | Status: DC | PRN
  Administered 2023-08-06: 2 mg via INTRAVENOUS

## 2023-08-06 MED ORDER — FENTANYL CITRATE (PF) 100 MCG/2ML IJ SOLN: 25.0000 ug | INTRAMUSCULAR | Status: DC | PRN

## 2023-08-06 MED ORDER — CHLORHEXIDINE GLUCONATE 0.12 % MT SOLN
OROMUCOSAL | Status: AC
  Filled 2023-08-06: qty 15

## 2023-08-06 SURGICAL SUPPLY — 50 items
ANCHOR BONE REGENETEN (Anchor) IMPLANT
ANCHOR HEALICOIL REGEN 5.5 (Anchor) IMPLANT
ANCHOR JUGGERKNOT WTAP NDL 2.9 (Anchor) IMPLANT
ANCHOR QFIX 2.8 SUT MINI TAPE (Anchor) IMPLANT
ANCHOR SUT W/ ORTHOCORD (Anchor) IMPLANT
ANCHOR TENDON REGENETEN (Staple) IMPLANT
BIT DRILL JUGRKNT W/NDL BIT2.9 (DRILL) IMPLANT
BLADE FULL RADIUS 3.5 (BLADE) ×1 IMPLANT
BUR ACROMIONIZER 4.0 (BURR) ×1 IMPLANT
CHLORAPREP W/TINT 26 (MISCELLANEOUS) ×1 IMPLANT
COVER MAYO STAND STRL (DRAPES) ×1 IMPLANT
DILATOR 5.5 THREADED HEALICOIL (MISCELLANEOUS) IMPLANT
DRILL JUGGERKNOT W/NDL BIT 2.9 (DRILL) IMPLANT
ELECT CAUTERY BLADE 6.4 (BLADE) ×1 IMPLANT
ELECT REM PT RETURN 9FT ADLT (ELECTROSURGICAL) ×1 IMPLANT
ELECTRODE REM PT RTRN 9FT ADLT (ELECTROSURGICAL) ×1 IMPLANT
GAUZE SPONGE 4X4 12PLY STRL (GAUZE/BANDAGES/DRESSINGS) ×1 IMPLANT
GAUZE XEROFORM 1X8 LF (GAUZE/BANDAGES/DRESSINGS) ×1 IMPLANT
GLOVE BIO SURGEON STRL SZ7.5 (GLOVE) ×2 IMPLANT
GLOVE BIO SURGEON STRL SZ8 (GLOVE) ×2 IMPLANT
GLOVE BIOGEL PI IND STRL 8 (GLOVE) ×1 IMPLANT
GLOVE INDICATOR 8.0 STRL GRN (GLOVE) ×1 IMPLANT
GOWN STRL REUS W/ TWL LRG LVL3 (GOWN DISPOSABLE) ×1 IMPLANT
GOWN STRL REUS W/ TWL XL LVL3 (GOWN DISPOSABLE) ×1 IMPLANT
GRASPER SUT 15 45D LOW PRO (SUTURE) IMPLANT
IMPL REGENETEN MEDIUM (Shoulder) IMPLANT
IMPLANT REGENETEN MEDIUM (Shoulder) ×1 IMPLANT
IV LR IRRIG 3000ML ARTHROMATIC (IV SOLUTION) ×2 IMPLANT
KIT CANNULA 8X76-LX IN CANNULA (CANNULA) ×1 IMPLANT
KIT SUTURE 2.8 Q-FIX DISP (MISCELLANEOUS) IMPLANT
MANIFOLD NEPTUNE II (INSTRUMENTS) ×1 IMPLANT
MASK FACE SPIDER DISP (MASK) ×1 IMPLANT
MAT ABSORB FLUID 56X50 GRAY (MISCELLANEOUS) ×1 IMPLANT
PACK ARTHROSCOPY SHOULDER (MISCELLANEOUS) ×1 IMPLANT
PAD ABD DERMACEA PRESS 5X9 (GAUZE/BANDAGES/DRESSINGS) ×2 IMPLANT
PASSER SUT FIRSTPASS SELF (INSTRUMENTS) IMPLANT
SLING ARM LRG DEEP (SOFTGOODS) ×1 IMPLANT
SLING ULTRA II LG (MISCELLANEOUS) ×1 IMPLANT
SPONGE T-LAP 18X18 ~~LOC~~+RFID (SPONGE) ×1 IMPLANT
STAPLER SKIN PROX 35W (STAPLE) ×1 IMPLANT
STRAP SAFETY 5IN WIDE (MISCELLANEOUS) ×1 IMPLANT
SUT ETHIBOND 0 MO6 C/R (SUTURE) ×1 IMPLANT
SUT ULTRABRAID 2 COBRAID 38 (SUTURE) IMPLANT
SUT VIC AB 2-0 CT1 TAPERPNT 27 (SUTURE) ×2 IMPLANT
TAPE MICROFOAM 4IN (TAPE) ×1 IMPLANT
TRAP FLUID SMOKE EVACUATOR (MISCELLANEOUS) ×1 IMPLANT
TUBE SET DOUBLEFLO INFLOW (TUBING) ×1 IMPLANT
TUBING CONNECTING 10 (TUBING) ×1 IMPLANT
WAND WEREWOLF FLOW 90D (MISCELLANEOUS) ×1 IMPLANT
WATER STERILE IRR 500ML POUR (IV SOLUTION) ×1 IMPLANT

## 2023-08-06 NOTE — Anesthesia Procedure Notes (Signed)
 Anesthesia Regional Block: Interscalene brachial plexus block   Pre-Anesthetic Checklist: , timeout performed,  Correct Patient, Correct Site, Correct Laterality,  Correct Procedure, Correct Position, site marked,  Risks and benefits discussed,  Surgical consent,  Pre-op evaluation,  At surgeon's request and post-op pain management  Laterality: Right  Prep: chloraprep       Needles:  Injection technique: Single-shot  Needle Type: Echogenic Needle     Needle Length: 4cm  Needle Gauge: 25     Additional Needles:   Procedures:,,,, ultrasound used (permanent image in chart),,    Narrative:  Start time: 08/06/2023 11:21 AM End time: 08/06/2023 11:23 AM Injection made incrementally with aspirations every 5 mL.  Performed by: Personally  Anesthesiologist: Leavy Ned, MD  Additional Notes: Patient's chart reviewed and they were deemed appropriate candidate for procedure, at surgeon's request. Patient educated about risks, benefits, and alternatives of the block including but not limited to: temporary or permanent nerve damage, bleeding, infection, damage to surround tissues, pneumothorax, hemidiaphragmatic paralysis, unilateral Horner's syndrome, block failure, local anesthetic toxicity. Patient expressed understanding. A formal time-out was conducted consistent with institution rules.  Monitors were applied, and minimal sedation used (see nursing record). The site was prepped with skin prep and allowed to dry, and sterile gloves were used. A high frequency linear ultrasound probe with probe cover was utilized throughout. C5-7 nerve roots located and appeared anatomically normal, local anesthetic injected around them, and echogenic block needle trajectory was monitored throughout. Aspiration performed every 5ml. Lung and blood vessels were avoided. All injections were performed without resistance and free of blood and paresthesias. The patient tolerated the procedure well.  Injectate:  20ml exparel  + 10ml 0.5% bupivacaine 

## 2023-08-06 NOTE — H&P (Signed)
 History of Present Illness: Leslie Lowe is a 58 y.o. who presents today for a history and physical. She is to undergo a right shoulder arthroscopy for rotator cuff repair. Since her last visit at the clinic there is been no improvement in her condition. The patient was initially scheduled for the surgery in November but became sick and had to postpone. The patient is scheduled to undergo a right shoulder arthroscopy with debridement, decompression, mini open biceps tenodesis, mini open rotator cuff repair and distal clavicle excision  The patient's symptoms began nearly 4 years ago and developed after picking up sticks in her yard . Initially, the patient saw Gustavo Level, PA-C, who ordered an MRI scan as he was concerned about a rotator cuff tear. This scan demonstrated a partial-thickness tear of the supraspinatus tendon. The patient elected to treat this nonsurgically and received a steroid injection at that time. Since then, she has undergone several additional steroid injections, all of which provided temporary partial relief of her symptoms. In July, 2024, the patient was reevaluated. At that time, because of continued recurrence of her symptoms, she was sent for repeat MRI scan which confirmed the progression of her partial-thickness tear to a full-thickness tear in the rotator cuff.   The patient describes the symptoms as moderate (patient is active but has had to make modifications or give up activities) and have the quality of being aching, miserable, nagging, stabbing, tender, and throbbing. The pain is localized to the lateral arm/shoulder and localized to the anterior shoulder. These symptoms are aggravated with normal daily activities, with sleeping, carrying heavy objects, at higher levels of activity, with overhead activity, and reaching behind the back. She has tried acetaminophen  and non-steroidal anti-inflammatories (Advil and Aleve ) temporary partial relief of her symptoms. She has tried  physical therapy , rest , and ice with limited benefit. She has tried numerous injections as noted above, also with very partial relief of her symptoms. She is right-hand dominant. She denies any neck pain, nor does she note any numbness or paresthesias down her arm to her hand.  Past Medical History: Allergic state Since childhood (Shellfish, mold, trees, grasses)  Anemia Chronic  Iron deficiency  Asthma (HHS-HCC) 02/09/2014  Asthma without status asthmaticus (HHS-HCC)  Benign essential hypertension 09/01/2017  GERD (gastroesophageal reflux disease)  Occasional  Heart murmur Since childhood  Mitral Valve Prolapse  Hyperlipidemia, mixed 10/16/2021  IBS (irritable bowel syndrome)  Migraine headache 02/09/2014  Migraines  Mitral valve prolapse 08/20/2017  MVP (mitral valve prolapse)  Torn rotator cuff   Past Surgical History: HERNIA REPAIR 06/17/2010 (Umbilical - Dr. Reyes Cota)  CHOLECYSTECTOMY 02/2016  COLONOSCOPY 10/31/2021 (Hemorrhoids/Repeat 10 years/CTL)  EGD 10/31/2021 (Barrett's Esophagus/Repeat 5 years/CTL)  Heel injection Left  UMBILICAL HERNIA REPAIR   Past Family History: Pulmonary embolism Mother  High blood pressure (Hypertension) Mother  Asthma Mother  Deep vein thrombosis (DVT or abnormal blood clot formation) Mother  Osteoarthritis Mother  Osteoporosis (Thinning of bones) Mother  Skin cancer Mother  Colon polyps Father  found in his early 68s  Diabetes Brother  Deep vein thrombosis (DVT or abnormal blood clot formation) Brother  Anxiety Daughter  Depression Daughter  Breast cancer Maternal Aunt  Breast cancer Maternal Grandmother  Stroke Maternal Grandfather  Breast cancer Other  Asthma Other  Heart disease Other  Arthritis Other  Other Other  foot disorders including plantar fasciitis   Medications: acetaminophen  (TYLENOL ) 500 MG tablet Take 1,000 mg by mouth as needed for Pain  albuterol  MDI, PROVENTIL, VENTOLIN, PROAIR, HFA 90 mcg/actuation  inhaler Inhale 2 inhalations into the lungs every 6 (six) hours as needed for Wheezing 6.7 each 11  desonide  (DESOWEN ) 0.05 % cream Apply 1 Application topically once daily as needed (as needed (nickel allergy).)  EPIPEN  2-PAK 0.3 mg/0.3 mL auto-injector INJECT 0.3 MLS (0.3 MG TOTAL) INTO THE MUSCLE ONCE AS NEEDED FOR ANAPHYLAXIS FOR UP TO 1 DOSE  ferrous gluconate (FERGON) 324 mg (37.5 mg iron) Tab tablet Take 325 mg by mouth every other day 3 times weekly per patient  fexofenadine (ALLEGRA) 180 MG tablet Take 180 mg by mouth once daily  fluticasone propionate (FLONASE) 50 mcg/actuation nasal spray SPRAY 2 SPRAYS INTO EACH NOSTRIL EVERY DAY 16 g 1  ketoconazole  (NIZORAL ) 2 % cream Apply 2 % topically 2 (two) times daily  losartan-hydroCHLOROthiazide (HYZAAR) 50-12.5 mg tablet Take 1 tablet by mouth once daily 90 tablet 3  multivitamin with minerals tablet Take 1 tablet by mouth once daily  omeprazole (PRILOSEC) 40 MG DR capsule TAKE 1 CAPSULE BY MOUTH EVERY DAY 90 capsule 3  pedi multivit no.19-folic acid (CHILDREN'S MULTI-VIT GUMMIES) 200 mcg Chew Take 2 tablets by mouth once daily  solifenacin (VESICARE) 5 MG tablet Take 1 tablet (5 mg total) by mouth once daily 30 tablet 11  terbinafine  HCL (LAMISIL ) 250 mg tablet Take 250 mg by mouth once daily  venlafaxine (EFFEXOR-XR) 75 MG XR capsule Take 1 capsule (75 mg total) by mouth once daily 90 capsule 3  phentermine (ADIPEX-P) 15 MG capsule Take 1 capsule (15 mg total) by mouth every other day for 90 days (Patient not taking: Reported on 08/03/2023) 15 capsule 2   Allergies:  Ace Inhibitors Cough  Amoxicillin Itching and Rash (Patient unsure if this is accurate) Phenergan [Promethazine] Unknown  Shellfish Containing Products (Itching, Nausea And Vomiting, wheezing) Sulfa (Sulfonamide Antibiotics) Unknown   Review of Systems A comprehensive 14 point ROS was performed, reviewed, and the pertinent orthopaedic findings are documented in the  HPI.  Physical Exam: BP (!) 130/90 (BP Location: Left upper arm, Patient Position: Sitting, BP Cuff Size: Adult)  Ht 167.6 cm (5' 6)  Wt 78.7 kg (173 lb 6.4 oz)  LMP 08/18/2021  BMI 27.99 kg/m   General: Well-developed well-nourished female seen in no acute distress.   HEENT: Atraumatic,normocephalic. Pupils are equal and reactive to light. Oropharynx is clear with moist mucosa  Lungs: Clear to auscultation bilaterally   Cardiovascular: Regular rate and rhythm. Normal S1, S2. No murmurs. No appreciable gallops or rubs. Peripheral pulses are palpable.  Abdomen: Soft, non-tender, nondistended. Bowel sounds present  Extremity: Right shoulder exam: SKIN: normal SWELLING: none WARMTH: none LYMPH NODES: no adenopathy palpable CREPITUS: none TENDERNESS: Mildly tender over anterolateral shoulder ROM (active):  Forward flexion: 150 degrees Abduction: 140 degrees Internal rotation: L1 ROM (passive):  Forward flexion: 160 degrees Abduction: 150 degrees  ER/IR at 90 abd: 90 degrees / 55 degrees  She experiences moderate pain with forward flexion and abduction, and mild pain with internal rotation.  STRENGTH: Forward flexion: 4/5 Abduction: 4/5 External rotation: 4-4+/5 Internal rotation: 4+/5 Pain with RC testing: Mild-moderate pain with resisted forward flexion and abduction.  STABILITY: Normal  SPECIAL TESTS: Vonzell' test: positive, mild Speed's test: positive Capsulitis - pain w/ passive ER: no Crossed arm test: Minimally positive Crank: Not evaluated Anterior apprehension: Negative Posterior apprehension: Not evaluated  Neurological: The patient is alert and oriented x3 Sensation to light touch appears to be intact and within normal limits Gross  motor strength appeared to be equal to 5/5  Vascular : Peripheral pulses felt to be palpable. Capillary refill appears to be intact and within normal limits  X-ray: Right Shoulder MRI: MRI Shoulder Cartilage:  No cartilage abnormality. MRI Shoulder Rotator Cuff: Full thickness tear of the supraspinatus. Retracted to the humeral head. By my review, there also appears to be at least a partial-thickness tear of the subscapularis tendon. MRI Shoulder Labrum / Biceps: Biceps tendinopathy with possible anterior subluxation. MRI Shoulder Bone: Normal bone.  Impression: 1. Nontraumatic complete tear of right rotator cuff.  Plan:  The treatment options were discussed with the patient. In addition, patient educational materials were provided regarding the diagnosis and treatment options. The patient is quite frustrated by her symptoms and functional limitations, and is ready to consider more aggressive treatment options. Therefore, I have recommended a surgical procedure, specifically a right shoulder arthroscopy with debridement, decompression, rotator cuff repair, and probable biceps tenodesis. The procedure was discussed with the patient, as were the potential risks (including bleeding, infection, nerve and/or blood vessel injury, persistent or recurrent pain, failure of the repair, progression of arthritis, need for further surgery, blood clots, strokes, heart attacks and/or arhythmias, pneumonia, etc.) and benefits. The patient states her understanding and wishes to proceed. All of the patient's questions and concerns were answered. She can call any time with further concerns. She will follow up post-surgery, routine.     H&P reviewed and patient re-examined. No changes.

## 2023-08-06 NOTE — Op Note (Signed)
 08/06/2023  2:49 PM  Patient:   Leslie Lowe  Pre-Op Diagnosis:   Traumatic rotator cuff tear with degenerative joint disease of AC joint and biceps tendinopathy, right shoulder  Post-Op Diagnosis:   Traumatic full-thickness rotator cuff tear, degenerative joint disease of AC joint, degenerative labral fraying, and biceps tendinopathy, right shoulder.  Procedure:   Extensive arthroscopic debridement, arthroscopic repair of subscapularis tendon tear, arthroscopic subacromial decompression, mini-open rotator cuff repair with Regeneten patch, and mini-open biceps tenodesis, right shoulder.  Anesthesia:   General endotracheal with interscalene block using Exparel  placed preoperatively by the anesthesiologist.  Surgeon:   DOROTHA Reyes Maltos, MD  Assistant:   Toribio Alas, RNFA: Windell Britain, PA-S  Findings:   As above. There was a complex delaminating full-thickness tear involving the entire supraspinatus insertional fibers, as well as an articular sided partial-thickness tear involving the superior insertional fibers of the subscapularis tendon. The remaining portions of the rotator cuff were in satisfactory condition. There was moderate lip sticking of the biceps tendon without partial full-thickness tearing. There was moderate labral fraying involving the anterior, superior, and posterior superior portions of the labrum without frank detachment from the glenoid rim. The articular surfaces of the glenoid and humerus both were in satisfactory condition.  Complications:   None  Fluids:   500 cc  Estimated blood loss:   15 cc  Tourniquet time:   None  Drains:   None  Closure:   Staples      Brief clinical note:   The patient is is a 58 year old female with a history of right shoulder pain since injuring her shoulder 4 years ago working in her yard. The patient's symptoms have progressed despite medications, activity modification, etc. The patient's history and examination are consistent with  impingement/tendinopathy with a rotator cuff tear. These findings were confirmed by MRI scan. The patient presents at this time for definitive management of these shoulder symptoms.  Procedure:   The patient underwent placement of an interscalene block using Exparel  by the anesthesiologist in the preoperative holding area before being brought into the operating room and lain in the supine position. The patient then underwent general endotracheal intubation and anesthesia before being repositioned in the beach chair position using the beach chair positioner. The right shoulder and upper extremity were prepped with ChloraPrep solution before being draped sterilely. Preoperative antibiotics were administered. A timeout was performed to confirm the proper surgical site before the expected portal sites and incision site were injected with 0.5% Sensorcaine  with epinephrine .   A posterior portal was created and the glenohumeral joint thoroughly inspected with the findings as described above. An anterior portal was created using an outside-in technique. The labrum and rotator cuff were further probed, again confirming the above-noted findings. The areas of labral fraying were debrided back to stable margins using the full-radius resector. In addition, the torn portions of the rotator cuff tears also were debrided back to stable margins using the full-radius resector. Finally, areas of synovitis were debrided back to stable margins, again using the full-radius resector. The ArthroCare wand was inserted and used to release the biceps tendon from its labral anchor.  It also was used to obtain hemostasis as well as to anneal the labrum superiorly and anteriorly.   A superolateral portal was created using an outside in technique to serve as a working portal for repair of the subscapularis tendon portion of the rotator cuff tear. The full-radius resector was used to abrade the exposed portion of the lesser  tuberosity to  optimize healing of the tendon. The subscapularis tendon was then repaired using a single Mitek BioKnotless anchor placed through the anterior portal the adequacy of repair was confirmed both by probing as well as with passive external rotation of the arm. The instruments were removed from the joint after suctioning the excess fluid.  The camera was repositioned through the posterior portal into the subacromial space. A separate lateral portal was created using an outside-in technique. The 3.5 mm full-radius resector was introduced and used to perform a subtotal bursectomy. The ArthroCare wand was then inserted and used to remove the periosteal tissue off the undersurface of the anterior third of the acromion as well as to recess the coracoacromial ligament from its attachment along the anterior and lateral margins of the acromion. The 4.0 mm acromionizing bur was introduced and used to complete the decompression by removing the undersurface of the anterior third of the acromion. The full radius resector was reintroduced to remove any residual bony debris before the ArthroCare wand was reintroduced to obtain hemostasis. The ArthroCare wand also was used to debride the undersurface of the distal clavicle in preparation for the distal clavicle excision.  The camera was repositioned through the lateral portal and the 4 mm acromionizing bur placed through the anterior portal. Under arthroscopic visualization, the lateral-most 6 to 8 mm of the distal clavicle were removed using the 4.0 mm acromionizing bur. The ArthroCare wand was reintroduced to verify hemostasis before the adequacy of distal clavicle excision was verified both from the anterior and lateral portals. The instruments were then removed from the subacromial space after suctioning the excess fluid.  An approximately 4-5 cm incision was made over the anterolateral aspect of the shoulder beginning at the anterolateral corner of the acromion and extending  distally in line with the bicipital groove. This incision was carried down through the subcutaneous tissues to expose the deltoid fascia. The raphae between the anterior and middle thirds was identified and this plane developed to provide access into the subacromial space. Additional bursal tissues were debrided sharply using Metzenbaum scissors. The rotator cuff tear was readily identified.   The bicipital groove was identified by palpation and opened for 1-1.5 cm. The biceps tendon stump was retrieved through this defect. The floor of the bicipital groove was roughened with a curet before a a single Biomet 2.9 mm JuggerKnot anchor was inserted. Both sets of sutures were passed through the biceps tendon and tied securely to effect the tenodesis. The bicipital sheath was reapproximated using two #0 Ethibond interrupted sutures, incorporating the biceps tendon to further reinforce the tenodesis.  The spinatus tendon tear was noted to be rather complex and delaminated in an unusual pattern, making repair difficult. The margins of the rotator cuff tear were debrided sharply with a #15 blade and the exposed greater tuberosity roughened with a rongeur. The tear was repaired using two Smith & Nephew 2.8 mm Q-Fix anchors. These sutures were then brought back laterally and secured using two Smith & Nephew Healicoil knotless RegeneSorb anchors to create a two-layer closure. An apparent watertight closure was obtained.  Because of the complex nature of the tear, the age of the patient, and the questionable quality of the tissues, it was felt best to reinforce/supplement this repair using a Smith & Nephew regenerative patch. Therefore, a medium patch was selected and applied over the repair before it was secured using the appropriate bone and soft tissue staples. This portion of the procedure added an extra measure  of complexity to the case and took an additional 20 to 30 minutes to complete.  The wound was copiously  irrigated with sterile saline solution before the deltoid raphae was reapproximated using 2-0 Vicryl interrupted sutures. The subcutaneous tissues were closed in two layers using 2-0 Vicryl interrupted sutures before the skin was closed using staples. The portal sites also were closed using staples. A sterile bulky dressing was applied to the shoulder before the arm was placed into a shoulder immobilizer. The patient was then awakened, extubated, and returned to the recovery room in satisfactory condition after tolerating the procedure well.

## 2023-08-06 NOTE — Anesthesia Preprocedure Evaluation (Signed)
 Anesthesia Evaluation  Patient identified by MRN, date of birth, ID band Patient awake    Reviewed: Allergy & Precautions, H&P , NPO status , Patient's Chart, lab work & pertinent test results  History of Anesthesia Complications (+) Family history of anesthesia reaction and history of anesthetic complications  Airway Mallampati: III  TM Distance: >3 FB Neck ROM: full    Dental  (+) Chipped, Dental Advidsory Given   Pulmonary neg shortness of breath, asthma    Pulmonary exam normal breath sounds clear to auscultation       Cardiovascular Exercise Tolerance: Good (-) angina (-) Past MI and (-) DOE Normal cardiovascular exam+ Valvular Problems/Murmurs MVP  Rhythm:regular Rate:Normal     Neuro/Psych  PSYCHIATRIC DISORDERS Anxiety Depression    negative neurological ROS     GI/Hepatic Neg liver ROS,GERD  Controlled and Medicated,,  Endo/Other  negative endocrine ROS    Renal/GU      Musculoskeletal   Abdominal   Peds  Hematology negative hematology ROS (+)   Anesthesia Other Findings Past Medical History: No date: Actinic keratosis No date: Anemia No date: Anxiety No date: Asthma No date: Depression 01/02/2010: Dysplastic nevus     Comment:  mid back, mild to mod atypia  No date: Dysrhythmia     Comment:  flutters occ  No date: Family history of adverse reaction to anesthesia     Comment:  pts mom-n/v No date: GERD (gastroesophageal reflux disease) No date: Headache No date: Heart murmur No date: IBS (irritable bowel syndrome) No date: Mitral valve prolapse  Past Surgical History: 2011: BREAST BIOPSY; Right     Comment:  neg/ ultrasound guided core 2011: BREAST CYST ASPIRATION; Right     Comment:  neg 03/10/2016: CHOLECYSTECTOMY; N/A     Comment:  Procedure: LAPAROSCOPIC CHOLECYSTECTOMY WITH               INTRAOPERATIVE CHOLANGIOGRAM;  Surgeon: Reyes LELON Cota, MD;  Location: [facility] ORS;   Service: General;                Laterality: N/A; 2007: COLONOSCOPY 2007: COLONOSCOPY WITH ESOPHAGOGASTRODUODENOSCOPY (EGD) 2011: HERNIA REPAIR     Comment:  umbilical  BMI    Body Mass Index: 28.68 kg/m      Reproductive/Obstetrics negative OB ROS                             Anesthesia Physical Anesthesia Plan  ASA: 3  Anesthesia Plan: General ETT and General   Post-op Pain Management: Regional block*   Induction: Intravenous  PONV Risk Score and Plan: 3 and Ondansetron , Dexamethasone  and Midazolam   Airway Management Planned: Oral ETT  Additional Equipment:   Intra-op Plan:   Post-operative Plan: Extubation in OR  Informed Consent: I have reviewed the patients History and Physical, chart, labs and discussed the procedure including the risks, benefits and alternatives for the proposed anesthesia with the patient or authorized representative who has indicated his/her understanding and acceptance.     Dental Advisory Given  Plan Discussed with: Anesthesiologist, CRNA and Surgeon  Anesthesia Plan Comments: (Patient consented for risks of anesthesia including but not limited to:  - adverse reactions to medications - damage to eyes, teeth, lips or other oral mucosa - nerve damage due to positioning  - sore throat or hoarseness - Damage to heart, brain, nerves, lungs, other parts of  body or loss of life  Patient voiced understanding and assent.)       Anesthesia Quick Evaluation

## 2023-08-06 NOTE — Transfer of Care (Signed)
 Immediate Anesthesia Transfer of Care Note  Patient: Leslie Lowe  Procedure(s) Performed: Right shoulder arthroscopy with debridement, decompression, mini open biceps tenodesis, mini open rotator cuff repair and distal clavicle excision (Right: Shoulder)  Patient Location: PACU  Anesthesia Type:General  Level of Consciousness: drowsy  Airway & Oxygen Therapy: Patient Spontanous Breathing and Patient connected to face mask oxygen  Post-op Assessment: Report given to RN and Post -op Vital signs reviewed and stable  Post vital signs: Reviewed and stable  Last Vitals:  Vitals Value Taken Time  BP 130/83 08/06/23 1447  Temp    Pulse 99 08/06/23 1449  Resp 18 08/06/23 1449  SpO2 92 % 08/06/23 1449  Vitals shown include unfiled device data.  Last Pain:  Vitals:   08/06/23 1129  TempSrc:   PainSc: 0-No pain         Complications: No notable events documented.

## 2023-08-06 NOTE — Progress Notes (Signed)
 Patient awake/alert x4, CMS+ to RUE. Tolerated po fluids/crackers without event. Able to perform IS without questions, tolerating. Instructed to continue at home this pm.

## 2023-08-06 NOTE — Anesthesia Procedure Notes (Signed)
 Procedure Name: Intubation Date/Time: 08/06/2023 12:19 PM  Performed by: Niki Manus SAUNDERS, CRNAPre-anesthesia Checklist: Patient identified, Patient being monitored, Timeout performed, Emergency Drugs available and Suction available Patient Re-evaluated:Patient Re-evaluated prior to induction Oxygen Delivery Method: Circle system utilized Preoxygenation: Pre-oxygenation with 100% oxygen Induction Type: IV induction Ventilation: Mask ventilation without difficulty Laryngoscope Size: Mac and 3 Grade View: Grade I Tube type: Oral Tube size: 7.0 mm Number of attempts: 1 Airway Equipment and Method: Stylet Placement Confirmation: ETT inserted through vocal cords under direct vision, positive ETCO2 and breath sounds checked- equal and bilateral Secured at: 21 cm Tube secured with: Tape Dental Injury: Teeth and Oropharynx as per pre-operative assessment

## 2023-08-06 NOTE — Discharge Instructions (Addendum)
 Orthopedic discharge instructions: Keep dressing dry and intact.  May shower after dressing changed on post-op day #4 (Monday).  Cover staples with Band-Aids after drying off. Apply ice frequently to shoulder. Take ibuprofen 600-800 mg TID OR Aleve 2 tablets BID with meals for 3-5 days, then as necessary. Take oxycodone  as prescribed when needed.  May supplement with ES Tylenol  if necessary. Keep shoulder immobilizer on at all times except may remove for bathing purposes. Follow-up in 10-14 days or as scheduled.    Scopolamine  Patch; Do NOT leave on after Saturday 1/11.  Wash hands after removing patch.Interscalene Nerve Block (ISNB) Discharge Instructions    For your surgery you have received an Interscalene Nerve Block. Nerve Blocks affect many types of nerves, including nerves that control movement, pain and normal sensation.  You may experience feelings such as numbness, tingling, heaviness, weakness or the inability to move your arm or the feeling or sensation that your arm has fallen asleep. A nerve block can last for 2 - 36 hours or more depending on the medication used.  Usually the weakness wears off first.  The tingling and heaviness usually wear off next.  Finally you may start to notice pain.  Keep in mind that this may occur in any order.  once a nerve block starts to wear off it is usually completely gone within 60 minutes. ISNB may cause mild shortness of breath, a hoarse voice, blurry vision, unequal pupils, or drooping of the face on the same side as the nerve block.  These symptoms will usually go away within 12 hours.  Very rarely the procedure itself can cause mild seizures. If needed, your surgeon will give you a prescription for pain medication.  It will take about 60 minutes for the oral pain medication to become fully effective.  So, it is recommended that you start taking this medication before the nerve block first begins to wear off, or when you first begin to feel  discomfort. Keep in mind that nerve blocks often wear off in the middle of the night.   If you are going to bed and the block has not started to wear off or you have not started to have any discomfort, consider setting an alarm for 2 to 3 hours, so you can assess your block.  If you notice the block is wearing off or you are starting to have discomfort, you can take your pain medication. Take your pain medication only as prescribed.  Pain medication can cause sedation and decrease your breathing if you take more than you need for the level of pain that you have. Nausea is a common side effect of many pain medications.  You may want to eat something before taking your pain medicine to prevent nausea. After an Interscalene nerve block, you cannot feel pain, pressure or extremes in temperature in the effected arm.  Because your arm is numb it is at an increased risk for injury.  To decrease the possibility of injury, please practice the following:  While you are awake change the position of your arm frequently to prevent too much pressure on any one area for prolonged periods of time.  If you have a cast or tight dressing, check the color or your fingers every couple of hours.  Call your surgeon with the appearance of any discoloration (white or blue). If you are given a sling to wear before you go home, please wear it  at all times until the block has completely worn off.  Do not get up at night without your sling. If you experience any problems or concerns, please contact your surgeon's office. If you experience severe or prolonged shortness of breath go to the nearest emergency department.

## 2023-08-07 NOTE — Anesthesia Postprocedure Evaluation (Signed)
 Anesthesia Post Note  Patient: Leslie Lowe  Procedure(s) Performed: Right shoulder arthroscopy with debridement, decompression, mini open biceps tenodesis, mini open rotator cuff repair and distal clavicle excision (Right: Shoulder)  Patient location during evaluation: PACU Anesthesia Type: General Level of consciousness: awake and alert Pain management: pain level controlled Vital Signs Assessment: post-procedure vital signs reviewed and stable Respiratory status: spontaneous breathing, nonlabored ventilation, respiratory function stable and patient connected to nasal cannula oxygen Cardiovascular status: blood pressure returned to baseline and stable Postop Assessment: no apparent nausea or vomiting Anesthetic complications: no   No notable events documented.   Last Vitals:  Vitals:   08/06/23 1611 08/06/23 1619  BP:  126/86  Pulse:  95  Resp:  16  Temp:  36.7 C  SpO2: 93% 94%    Last Pain:  Vitals:   08/06/23 1619  TempSrc: Temporal  PainSc: 2                  Fairy POUR Izaia Say

## 2023-08-08 ENCOUNTER — Encounter: Payer: Self-pay | Admitting: Surgery

## 2023-11-12 ENCOUNTER — Other Ambulatory Visit: Payer: Self-pay | Admitting: Obstetrics and Gynecology

## 2023-11-12 ENCOUNTER — Encounter: Payer: Self-pay | Admitting: Surgery

## 2023-11-12 DIAGNOSIS — Z1231 Encounter for screening mammogram for malignant neoplasm of breast: Secondary | ICD-10-CM

## 2023-11-23 ENCOUNTER — Encounter: Payer: Self-pay | Admitting: Surgery

## 2024-01-04 ENCOUNTER — Encounter: Payer: Self-pay | Admitting: Surgery

## 2024-02-15 ENCOUNTER — Ambulatory Visit
Admission: RE | Admit: 2024-02-15 | Discharge: 2024-02-15 | Disposition: A | Discharge status: Home / Self Care | Source: Ambulatory Visit | Attending: Obstetrics and Gynecology | Admitting: Obstetrics and Gynecology

## 2024-02-15 DIAGNOSIS — Z1231 Encounter for screening mammogram for malignant neoplasm of breast: Secondary | ICD-10-CM | POA: Insufficient documentation

## 2024-02-24 ENCOUNTER — Encounter: Payer: Self-pay | Admitting: Surgery

## 2024-03-07 ENCOUNTER — Ambulatory Visit: Payer: BC Managed Care – PPO | Admitting: Dermatology

## 2024-03-07 DIAGNOSIS — L820 Inflamed seborrheic keratosis: Secondary | ICD-10-CM

## 2024-03-07 DIAGNOSIS — D2271 Melanocytic nevi of right lower limb, including hip: Secondary | ICD-10-CM

## 2024-03-07 DIAGNOSIS — B079 Viral wart, unspecified: Secondary | ICD-10-CM

## 2024-03-07 DIAGNOSIS — D229 Melanocytic nevi, unspecified: Secondary | ICD-10-CM | POA: Diagnosis not present

## 2024-03-07 DIAGNOSIS — D170 Benign lipomatous neoplasm of skin and subcutaneous tissue of head, face and neck: Secondary | ICD-10-CM

## 2024-03-07 DIAGNOSIS — L230 Allergic contact dermatitis due to metals: Secondary | ICD-10-CM

## 2024-03-07 DIAGNOSIS — Z1283 Encounter for screening for malignant neoplasm of skin: Secondary | ICD-10-CM | POA: Diagnosis not present

## 2024-03-07 DIAGNOSIS — L578 Other skin changes due to chronic exposure to nonionizing radiation: Secondary | ICD-10-CM | POA: Diagnosis not present

## 2024-03-07 DIAGNOSIS — L7211 Pilar cyst: Secondary | ICD-10-CM

## 2024-03-07 DIAGNOSIS — W908XXA Exposure to other nonionizing radiation, initial encounter: Secondary | ICD-10-CM | POA: Diagnosis not present

## 2024-03-07 DIAGNOSIS — D239 Other benign neoplasm of skin, unspecified: Secondary | ICD-10-CM

## 2024-03-07 DIAGNOSIS — Z86018 Personal history of other benign neoplasm: Secondary | ICD-10-CM

## 2024-03-07 DIAGNOSIS — D333 Benign neoplasm of cranial nerves: Secondary | ICD-10-CM

## 2024-03-07 DIAGNOSIS — L304 Erythema intertrigo: Secondary | ICD-10-CM

## 2024-03-07 DIAGNOSIS — D489 Neoplasm of uncertain behavior, unspecified: Secondary | ICD-10-CM | POA: Diagnosis not present

## 2024-03-07 DIAGNOSIS — B351 Tinea unguium: Secondary | ICD-10-CM

## 2024-03-07 NOTE — Progress Notes (Signed)
 Follow-Up Visit   Subjective  Leslie Lowe is a 58 y.o. female who presents for the following: Skin Cancer Screening and Full Body Skin Exam Hx of dysplastic nevi at mid back, hx of isks Check Spot at left forehead, spot at left shoulder / neck area, right knee area and scalp        The patient presents for Total-Body Skin Exam (TBSE) for skin cancer screening and mole check. The patient has spots, moles and lesions to be evaluated, some may be new or changing and the patient may have concern these could be cancer.    The following portions of the chart were reviewed this encounter and updated as appropriate: medications, allergies, medical history  Review of Systems:  No other skin or systemic complaints except as noted in HPI or Assessment and Plan.  Objective  Well appearing patient in no apparent distress; mood and affect are within normal limits.  A full examination was performed including scalp, head, eyes, ears, nose, lips, neck, chest, axillae, abdomen, back, buttocks, bilateral upper extremities, bilateral lower extremities, hands, feet, fingers, toes, fingernails, and toenails. All findings within normal limits unless otherwise noted below.   Relevant physical exam findings are noted in the Assessment and Plan.  right lateral knee 3 mm speckled brown macule   left temple scalp 0.6 cm flesh papule   right clavicle x 1 Erythematous stuck-on, waxy papule or plaque right index finger tip x 1 3 mm firm flesh papule at right index finger tip  Assessment & Plan   SKIN CANCER SCREENING PERFORMED TODAY.  ACTINIC DAMAGE - Chronic condition, secondary to cumulative UV/sun exposure - diffuse scaly erythematous macules with underlying dyspigmentation - Recommend daily broad spectrum sunscreen SPF 30+ to sun-exposed areas, reapply every 2 hours as needed.  - Staying in the shade or wearing long sleeves, sun glasses (UVA+UVB protection) and wide brim hats (4-inch brim  around the entire circumference of the hat) are also recommended for sun protection.  - Call for new or changing lesions.  LENTIGINES, SEBORRHEIC KERATOSES, HEMANGIOMAS - Benign normal skin lesions - Benign-appearing - Call for any changes   MELANOCYTIC NEVI - Right spinal lower back 2.5 mm medium brown macule  - R spinal upper back: 2.72mm brown macules x 2 - Right lateral knee: 3.25mm brown macule with dark brown small macule superior  - Right Medial Heel: 2.42mm medium  brown macule - Right Medial Plantar Foot: 3.51mm med brown macule - Right upper arm 4 mm med brown macule  - Right posterior plantar foot 5 mm light tan macule - Tan-brown and/or pink-flesh-colored symmetric macules and papules - Benign appearing on exam today - Observation - Call clinic for new or changing moles - Recommend daily use of broad spectrum spf 30+ sunscreen to sun-exposed areas.   DERMATOFIBROMA Exam: Firm pink/brown papulenodule with dimple sign. At left medial thigh, right medial thigh and right pretibia  Treatment Plan: A dermatofibroma is a benign growth possibly related to trauma, such as an insect bite, cut from shaving, or inflamed acne-type bump.  Treatment options to remove include shave or excision with resulting scar and risk of recurrence.  Since benign-appearing and not bothersome, will observe for now.   Sebaceous Hyperplasia - Small yellow papules with a central dell at forehead  - Benign-appearing - Observe. Call for changes.  Acrochordons (Skin Tags) B/l axilla , inframammary  - Fleshy, skin-colored pedunculated papules - Benign appearing.  - Observe. - If desired, they can be  removed with an in office procedure that is not covered by insurance. - Please call the clinic if you notice any new or changing lesions.  Lipoma Exam: 2 cm Subcutaneous rubbery nodule(s) Location:  left frontal hairline    Benign-appearing. Exam most consistent with a lipoma. Discussed that a lipoma is a  benign fatty growth that can grow over time and sometimes get irritated. Recommend observation if it is not bothersome or changing. Discussed option of ILK injections or surgical excision to remove it if it is growing, symptomatic, or other changes noted. Please call for new or changing lesions so they can be evaluated.  Since bothersome will refer to Dr. Corey in [place] for surgical excision    Tinea unguium Right Thumbnail   Exam: Clear at exam today   Chronic condition with duration or expected duration over one year. Currently well-controlled.  Resolved with treatment- PO terbinafine  and Kerydin  solution  Plan:  No treatment needed at this time  INTERTRIGO Exam: inframammary clear today    Chronic condition with duration or expected duration over one year. Currently well-controlled.    Intertrigo is a chronic recurrent rash that occurs in skin fold areas that may be associated with friction; heat; moisture; yeast; fungus; and bacteria.  It is exacerbated by increased movement / activity; sweating; and higher atmospheric temperature.   Treatment Plan Continue ketoconazole  2% cream Apply to affected areas rash under breast 1-2x daily as needed for flares   Recommend OTC Zeasorb AF powder to body folds daily after shower.  It is often found in the athlete's foot section in the pharmacy.  Avoid using powders that contain cornstarch.   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 but flares off and on depending on if she gets exposed to nickel containing jewelry  Chronic condition with duration or expected duration over one year. Currently well-controlled.  Treatment Plan: Continue desonide  cream bid as needed for nickel allergy   HISTORY OF DYSPLASTIC NEVUS Mid back mild to mod 01/02/2010 No evidence of recurrence today Recommend regular full body skin exams Recommend daily broad spectrum sunscreen SPF 30+ to sun-exposed areas, reapply every 2  hours as needed.  Call if any new or changing lesions are noted between office visits  Pilar Cyst Exam: Cyst at vertex scalp 1 cm firm SQ nodule  Benign-appearing. Exam most consistent with a pilar cyst. Discussed that a cyst is a benign growth that can grow over time and sometimes get irritated or inflamed. Recommend observation if it is not bothersome. Discussed option of surgical excision to remove it if it is growing, symptomatic, or other changes noted. Please call for new or changing lesions so they can be evaluated.     NEOPLASM OF UNCERTAIN BEHAVIOR (2) right lateral knee Epidermal / dermal shaving  Lesion diameter (cm):  0.3 Informed consent: discussed and consent obtained   Patient was prepped and draped in usual sterile fashion: Area prepped with alcohol. Anesthesia: the lesion was anesthetized in a standard fashion   Anesthetic:  1% lidocaine  w/ epinephrine  1-100,000 buffered w/ 8.4% NaHCO3 Instrument used: flexible razor blade   Hemostasis achieved with: pressure, aluminum chloride and electrodesiccation   Outcome: patient tolerated procedure well   Post-procedure details: wound care instructions given   Post-procedure details comment:  Ointment and small bandage applied.   Specimen 1 - Surgical pathology Differential Diagnosis: nevus r/o dysplasia   Check Margins: yes left temple scalp Epidermal / dermal shaving  Lesion  diameter (cm):  0.6 Informed consent: discussed and consent obtained   Patient was prepped and draped in usual sterile fashion: Area prepped with alcohol. Anesthesia: the lesion was anesthetized in a standard fashion   Anesthetic:  1% lidocaine  w/ epinephrine  1-100,000 buffered w/ 8.4% NaHCO3 Instrument used: flexible razor blade   Hemostasis achieved with: pressure, aluminum chloride and electrodesiccation   Outcome: patient tolerated procedure well   Post-procedure details: wound care instructions given   Post-procedure details comment:   Ointment and small bandage applied.   Specimen 2 - Surgical pathology Differential Diagnosis: irritated nevus r/o other  Check Margins: yes Nevus r/o dysplasia for right lateral knee  Irritated nevus r/o other  INFLAMED SEBORRHEIC KERATOSIS right clavicle x 1 Symptomatic, irritating, patient would like treated. Destruction of lesion - right clavicle x 1  Destruction method: cryotherapy   Informed consent: discussed and consent obtained   Lesion destroyed using liquid nitrogen: Yes   Region frozen until ice ball extended beyond lesion: Yes   Outcome: patient tolerated procedure well with no complications   Post-procedure details: wound care instructions given   Additional details:  Prior to procedure, discussed risks of blister formation, small wound, skin dyspigmentation, or rare scar following cryotherapy. Recommend Vaseline ointment to treated areas while healing.   VIRAL WARTS, UNSPECIFIED TYPE right index finger tip x 1  Vs callus 2nd LN2 treatment today  Viral Wart (HPV) Counseling  Discussed viral / HPV (Human Papilloma Virus) etiology and risk of spread /infectivity to other areas of body as well as to other people.  Multiple treatments and methods may be required to clear warts and it is possible treatment may not be successful.  Treatment risks include discoloration; scarring and there is still potential for wart recurrence. Destruction of lesion - right index finger tip x 1  Destruction method: cryotherapy   Informed consent: discussed and consent obtained   Lesion destroyed using liquid nitrogen: Yes   Region frozen until ice ball extended beyond lesion: Yes   Outcome: patient tolerated procedure well with no complications   Post-procedure details: wound care instructions given   Additional details:  Prior to procedure, discussed risks of blister formation, small wound, skin dyspigmentation, or rare scar following cryotherapy. Recommend Vaseline ointment to treated  areas while healing.   Return in about 1 year (around 03/07/2025) for TBSE.  I, Eleanor Blush, CMA, am acting as scribe for Rexene Rattler, MD.   Documentation: I have reviewed the above documentation for accuracy and completeness, and I agree with the above.  Rexene Rattler, MD

## 2024-03-07 NOTE — Patient Instructions (Addendum)

## 2024-03-08 NOTE — Addendum Note (Signed)
 Addended by: TANDA SETTER A on: 03/08/2024 08:13 AM   Modules accepted: Orders

## 2024-03-11 LAB — SURGICAL PATHOLOGY

## 2024-03-14 ENCOUNTER — Encounter: Payer: Self-pay | Admitting: Dermatology

## 2024-03-14 ENCOUNTER — Ambulatory Visit: Payer: Self-pay | Admitting: Dermatology

## 2024-03-14 NOTE — Telephone Encounter (Signed)
-----   Message from Rexene Rattler sent at 03/14/2024 10:19 AM EDT ----- 1. Skin, right lateral knee :       DYSPLASTIC JUNCTIONAL NEVUS WITH MODERATE ATYPIA  2. Skin, left temple scalp :       NEUROFIBROMA   Moderately atypical mole, margins free- observation Benign growth, may recur  - please call patient ----- Message ----- From: Interface, [facility] Sent: 03/11/2024   5:36 PM EDT To: Rexene Rattler, MD

## 2024-03-14 NOTE — Telephone Encounter (Signed)
 Advised patient biopsy of the right lateral knee was dysplastic nevus, mod, margins free and left temple scalp was benign neurofibroma. No further treatment needed.

## 2024-06-20 ENCOUNTER — Ambulatory Visit: Admitting: Dermatology

## 2024-10-31 ENCOUNTER — Ambulatory Visit: Admitting: Dermatology

## 2025-03-14 ENCOUNTER — Encounter: Admitting: Dermatology
